# Patient Record
Sex: Female | Born: 1975 | Race: Black or African American | Hispanic: No | Marital: Married | Smoking: Never smoker
Health system: Southern US, Community
[De-identification: ages and names within clinical notes are randomized; demographics above are authoritative.]

## PROBLEM LIST (undated history)

## (undated) DIAGNOSIS — F39 Unspecified mood [affective] disorder: Secondary | ICD-10-CM

## (undated) DIAGNOSIS — O139 Gestational [pregnancy-induced] hypertension without significant proteinuria, unspecified trimester: Secondary | ICD-10-CM

## (undated) DIAGNOSIS — Z860100 Personal history of colon polyps, unspecified: Secondary | ICD-10-CM

## (undated) DIAGNOSIS — J45909 Unspecified asthma, uncomplicated: Secondary | ICD-10-CM

## (undated) DIAGNOSIS — R519 Headache, unspecified: Secondary | ICD-10-CM

## (undated) DIAGNOSIS — I10 Essential (primary) hypertension: Secondary | ICD-10-CM

## (undated) DIAGNOSIS — E119 Type 2 diabetes mellitus without complications: Secondary | ICD-10-CM

## (undated) HISTORY — DX: Personal history of colonic polyps: Z86.010

## (undated) HISTORY — DX: Essential (primary) hypertension: I10

## (undated) HISTORY — DX: Headache, unspecified: R51.9

## (undated) HISTORY — DX: Type 2 diabetes mellitus without complications: E11.9

## (undated) HISTORY — PX: WISDOM TOOTH EXTRACTION: SHX21

## (undated) HISTORY — DX: Unspecified mood (affective) disorder: F39

## (undated) HISTORY — DX: Unspecified asthma, uncomplicated: J45.909

## (undated) HISTORY — DX: Personal history of colon polyps, unspecified: Z86.0100

## (undated) HISTORY — PX: BREAST REDUCTION SURGERY: SHX8

---

## 2004-07-19 ENCOUNTER — Other Ambulatory Visit: Admission: RE | Admit: 2004-07-19 | Discharge: 2004-07-19 | Payer: Self-pay | Admitting: Family Medicine

## 2005-10-04 ENCOUNTER — Other Ambulatory Visit: Admission: RE | Admit: 2005-10-04 | Discharge: 2005-10-04 | Payer: Self-pay | Admitting: Family Medicine

## 2005-10-14 ENCOUNTER — Ambulatory Visit (HOSPITAL_COMMUNITY): Admission: RE | Admit: 2005-10-14 | Discharge: 2005-10-14 | Payer: Self-pay | Admitting: Obstetrics and Gynecology

## 2006-01-17 ENCOUNTER — Ambulatory Visit (HOSPITAL_COMMUNITY): Admission: RE | Admit: 2006-01-17 | Discharge: 2006-01-17 | Payer: Self-pay | Admitting: Obstetrics and Gynecology

## 2006-05-25 ENCOUNTER — Inpatient Hospital Stay (HOSPITAL_COMMUNITY): Admission: AD | Admit: 2006-05-25 | Discharge: 2006-05-28 | Payer: Self-pay | Admitting: Obstetrics and Gynecology

## 2006-06-03 ENCOUNTER — Inpatient Hospital Stay (HOSPITAL_COMMUNITY): Admission: AD | Admit: 2006-06-03 | Discharge: 2006-06-03 | Payer: Self-pay | Admitting: Obstetrics and Gynecology

## 2006-08-22 HISTORY — PX: REDUCTION MAMMAPLASTY: SUR839

## 2008-07-14 ENCOUNTER — Other Ambulatory Visit: Admission: RE | Admit: 2008-07-14 | Discharge: 2008-07-14 | Payer: Self-pay | Admitting: Obstetrics and Gynecology

## 2008-10-28 ENCOUNTER — Encounter: Admission: RE | Admit: 2008-10-28 | Discharge: 2008-10-28 | Payer: Self-pay | Admitting: Obstetrics and Gynecology

## 2010-05-21 ENCOUNTER — Inpatient Hospital Stay (HOSPITAL_COMMUNITY): Admission: AD | Admit: 2010-05-21 | Discharge: 2010-05-21 | Payer: Self-pay | Admitting: Obstetrics and Gynecology

## 2010-07-27 ENCOUNTER — Other Ambulatory Visit
Admission: RE | Admit: 2010-07-27 | Discharge: 2010-07-27 | Payer: Self-pay | Source: Home / Self Care | Admitting: Obstetrics and Gynecology

## 2010-11-04 LAB — CBC
HCT: 35.5 % — ABNORMAL LOW (ref 36.0–46.0)
Hemoglobin: 11.8 g/dL — ABNORMAL LOW (ref 12.0–15.0)
MCH: 29.5 pg (ref 26.0–34.0)
MCHC: 33.3 g/dL (ref 30.0–36.0)
MCV: 88.7 fL (ref 78.0–100.0)
Platelets: 257 10*3/uL (ref 150–400)
RBC: 4 MIL/uL (ref 3.87–5.11)
RDW: 17.7 % — ABNORMAL HIGH (ref 11.5–15.5)

## 2010-11-04 LAB — HCG, QUANTITATIVE, PREGNANCY: hCG, Beta Chain, Quant, S: 16249 m[IU]/mL — ABNORMAL HIGH (ref <5)

## 2011-06-30 ENCOUNTER — Inpatient Hospital Stay (HOSPITAL_COMMUNITY)
Admission: AD | Admit: 2011-06-30 | Discharge: 2011-07-03 | DRG: 775 | Disposition: A | Discharge status: Home / Self Care | Payer: 59 | Source: Ambulatory Visit | Attending: Obstetrics and Gynecology | Admitting: Obstetrics and Gynecology

## 2011-06-30 ENCOUNTER — Encounter (HOSPITAL_COMMUNITY): Payer: Self-pay | Admitting: *Deleted

## 2011-06-30 HISTORY — DX: Gestational (pregnancy-induced) hypertension without significant proteinuria, unspecified trimester: O13.9

## 2011-06-30 NOTE — Progress Notes (Signed)
Pt c/o ctx that have been getting stronger at home.  No leaking of fluid or bleeding.

## 2011-07-01 ENCOUNTER — Encounter (HOSPITAL_COMMUNITY): Payer: Self-pay

## 2011-07-01 LAB — CBC
HCT: 30.2 % — ABNORMAL LOW (ref 36.0–46.0)
MCV: 84.8 fL (ref 78.0–100.0)
Platelets: 232 10*3/uL (ref 150–400)
RBC: 3.56 MIL/uL — ABNORMAL LOW (ref 3.87–5.11)
WBC: 6.7 10*3/uL (ref 4.0–10.5)

## 2011-07-01 LAB — ABO/RH: RH Type: POSITIVE

## 2011-07-01 LAB — RUBELLA ANTIBODY, IGM: Rubella: IMMUNE

## 2011-07-01 LAB — HIV ANTIBODY (ROUTINE TESTING W REFLEX): HIV: NONREACTIVE

## 2011-07-01 MED ORDER — OXYTOCIN BOLUS FROM INFUSION
500.0000 mL | Freq: Once | INTRAVENOUS | Status: DC
  Filled 2011-07-01: qty 500
  Filled 2011-07-01: qty 1000

## 2011-07-01 MED ORDER — TETANUS-DIPHTH-ACELL PERTUSSIS 5-2.5-18.5 LF-MCG/0.5 IM SUSP
0.5000 mL | Freq: Once | INTRAMUSCULAR | Status: DC
  Filled 2011-07-01: qty 0.5

## 2011-07-01 MED ORDER — ONDANSETRON HCL 4 MG/2ML IJ SOLN: 4.0000 mg | INTRAMUSCULAR | Status: DC | PRN

## 2011-07-01 MED ORDER — WITCH HAZEL-GLYCERIN EX PADS: 1.0000 "application " | MEDICATED_PAD | CUTANEOUS | Status: DC | PRN

## 2011-07-01 MED ORDER — SIMETHICONE 80 MG PO CHEW: 80.0000 mg | CHEWABLE_TABLET | ORAL | Status: DC | PRN

## 2011-07-01 MED ORDER — IBUPROFEN 600 MG PO TABS
600.0000 mg | ORAL_TABLET | Freq: Four times a day (QID) | ORAL | Status: DC | PRN
  Administered 2011-07-01: 600 mg via ORAL
  Filled 2011-07-01: qty 1

## 2011-07-01 MED ORDER — TERBUTALINE SULFATE 1 MG/ML IJ SOLN: 0.2500 mg | Freq: Once | INTRAMUSCULAR | Status: DC | PRN

## 2011-07-01 MED ORDER — OXYTOCIN 20 UNITS IN LACTATED RINGERS INFUSION - SIMPLE
1.0000 m[IU]/min | INTRAVENOUS | Status: DC
  Administered 2011-07-01: 9 m[IU]/min via INTRAVENOUS
  Administered 2011-07-01: 7 m[IU]/min via INTRAVENOUS
  Administered 2011-07-01: 1 m[IU]/min via INTRAVENOUS

## 2011-07-01 MED ORDER — IBUPROFEN 600 MG PO TABS
600.0000 mg | ORAL_TABLET | Freq: Four times a day (QID) | ORAL | Status: DC
  Administered 2011-07-02: 600 mg via ORAL
  Filled 2011-07-01: qty 1

## 2011-07-01 MED ORDER — FLEET ENEMA 7-19 GM/118ML RE ENEM: 1.0000 | ENEMA | RECTAL | Status: DC | PRN

## 2011-07-01 MED ORDER — ONDANSETRON HCL 4 MG PO TABS: 4.0000 mg | ORAL_TABLET | ORAL | Status: DC | PRN

## 2011-07-01 MED ORDER — ZOLPIDEM TARTRATE 10 MG PO TABS
10.0000 mg | ORAL_TABLET | Freq: Once | ORAL | Status: AC
  Administered 2011-07-01: 10 mg via ORAL
  Filled 2011-07-01 (×2): qty 1

## 2011-07-01 MED ORDER — ZOLPIDEM TARTRATE 5 MG PO TABS: 5.0000 mg | ORAL_TABLET | Freq: Every evening | ORAL | Status: DC | PRN

## 2011-07-01 MED ORDER — SENNOSIDES-DOCUSATE SODIUM 8.6-50 MG PO TABS: 2.0000 | ORAL_TABLET | Freq: Every day | ORAL | Status: DC

## 2011-07-01 MED ORDER — DIPHENHYDRAMINE HCL 25 MG PO CAPS: 25.0000 mg | ORAL_CAPSULE | Freq: Four times a day (QID) | ORAL | Status: DC | PRN

## 2011-07-01 MED ORDER — LIDOCAINE HCL (PF) 1 % IJ SOLN
30.0000 mL | INTRAMUSCULAR | Status: DC | PRN
  Filled 2011-07-01: qty 30

## 2011-07-01 MED ORDER — IBUPROFEN 600 MG PO TABS: 600.0000 mg | ORAL_TABLET | Freq: Four times a day (QID) | ORAL | Status: DC | PRN

## 2011-07-01 MED ORDER — LACTATED RINGERS IV SOLN: 500.0000 mL | INTRAVENOUS | Status: DC | PRN

## 2011-07-01 MED ORDER — ONDANSETRON HCL 4 MG/2ML IJ SOLN: 4.0000 mg | Freq: Four times a day (QID) | INTRAMUSCULAR | Status: DC | PRN

## 2011-07-01 MED ORDER — CITRIC ACID-SODIUM CITRATE 334-500 MG/5ML PO SOLN: 30.0000 mL | ORAL | Status: DC | PRN

## 2011-07-01 MED ORDER — TETANUS-DIPHTH-ACELL PERTUSSIS 5-2.5-18.5 LF-MCG/0.5 IM SUSP
0.5000 mL | Freq: Once | INTRAMUSCULAR | Status: AC
  Administered 2011-07-02: 0.5 mL via INTRAMUSCULAR
  Filled 2011-07-01 (×2): qty 0.5

## 2011-07-01 MED ORDER — BENZOCAINE-MENTHOL 20-0.5 % EX AERO
1.0000 "application " | INHALATION_SPRAY | CUTANEOUS | Status: DC | PRN
  Filled 2011-07-01: qty 56

## 2011-07-01 MED ORDER — MEDROXYPROGESTERONE ACETATE 150 MG/ML IM SUSP: 150.0000 mg | INTRAMUSCULAR | Status: DC | PRN

## 2011-07-01 MED ORDER — OXYCODONE-ACETAMINOPHEN 5-325 MG PO TABS
2.0000 | ORAL_TABLET | ORAL | Status: DC | PRN
  Filled 2011-07-01: qty 1
  Filled 2011-07-01: qty 2

## 2011-07-01 MED ORDER — OXYCODONE-ACETAMINOPHEN 5-325 MG PO TABS
1.0000 | ORAL_TABLET | ORAL | Status: DC | PRN
  Administered 2011-07-02: 1 via ORAL
  Administered 2011-07-02: 2 via ORAL
  Administered 2011-07-02 – 2011-07-03 (×2): 1 via ORAL
  Administered 2011-07-03: 2 via ORAL
  Administered 2011-07-03: 1 via ORAL
  Filled 2011-07-01: qty 1
  Filled 2011-07-01 (×3): qty 2
  Filled 2011-07-01 (×2): qty 1

## 2011-07-01 MED ORDER — ACETAMINOPHEN 325 MG PO TABS: 650.0000 mg | ORAL_TABLET | ORAL | Status: DC | PRN

## 2011-07-01 MED ORDER — MEASLES, MUMPS & RUBELLA VAC ~~LOC~~ INJ
0.5000 mL | INJECTION | Freq: Once | SUBCUTANEOUS | Status: DC
  Filled 2011-07-01: qty 0.5

## 2011-07-01 MED ORDER — PRENATAL PLUS 27-1 MG PO TABS: 1.0000 | ORAL_TABLET | Freq: Every day | ORAL | Status: DC

## 2011-07-01 MED ORDER — DIBUCAINE 1 % RE OINT
1.0000 "application " | TOPICAL_OINTMENT | RECTAL | Status: DC | PRN
  Filled 2011-07-01: qty 28

## 2011-07-01 MED ORDER — LANOLIN HYDROUS EX OINT: TOPICAL_OINTMENT | CUTANEOUS | Status: DC | PRN

## 2011-07-01 MED ORDER — OXYTOCIN 20 UNITS IN LACTATED RINGERS INFUSION - SIMPLE
125.0000 mL/h | Freq: Once | INTRAVENOUS | Status: AC
  Administered 2011-07-01: 999 mL/h via INTRAVENOUS

## 2011-07-01 MED ORDER — LACTATED RINGERS IV SOLN
INTRAVENOUS | Status: DC
  Administered 2011-07-01 (×2): via INTRAVENOUS

## 2011-07-01 MED ORDER — SENNOSIDES-DOCUSATE SODIUM 8.6-50 MG PO TABS
2.0000 | ORAL_TABLET | Freq: Every day | ORAL | Status: DC
  Administered 2011-07-02: 2 via ORAL

## 2011-07-01 MED ORDER — BUTORPHANOL TARTRATE 2 MG/ML IJ SOLN
1.0000 mg | INTRAMUSCULAR | Status: DC | PRN
  Administered 2011-07-01 (×2): 1 mg via INTRAVENOUS
  Filled 2011-07-01 (×2): qty 1

## 2011-07-01 MED ORDER — PRENATAL PLUS 27-1 MG PO TABS
1.0000 | ORAL_TABLET | Freq: Every day | ORAL | Status: DC
  Administered 2011-07-03: 1 via ORAL
  Filled 2011-07-01: qty 1

## 2011-07-01 MED ORDER — IBUPROFEN 600 MG PO TABS
600.0000 mg | ORAL_TABLET | Freq: Four times a day (QID) | ORAL | Status: DC
  Administered 2011-07-02 – 2011-07-03 (×5): 600 mg via ORAL
  Filled 2011-07-01 (×5): qty 1

## 2011-07-01 MED ORDER — OXYCODONE-ACETAMINOPHEN 5-325 MG PO TABS: 2.0000 | ORAL_TABLET | ORAL | Status: DC | PRN

## 2011-07-01 MED ORDER — OXYCODONE-ACETAMINOPHEN 5-325 MG PO TABS: 1.0000 | ORAL_TABLET | ORAL | Status: DC | PRN

## 2011-07-01 MED ORDER — LIDOCAINE HCL (PF) 1 % IJ SOLN: 30.0000 mL | INTRAMUSCULAR | Status: DC | PRN

## 2011-07-01 NOTE — Progress Notes (Signed)
In to assess patient.  Cx 5/ 70/-1 Arom clear fluid IUPC placed.  FHR baseline 130 good btbv +accels no decels Toco : ctx q 2-3 minutes  A/P 38 wks 6 days  Pitocin for augmentation  Anticipate svd

## 2011-07-01 NOTE — Progress Notes (Signed)
Pt transferred from 169 back to MAU rm#3 due to pt acuity in [facility] of care is for intermittent monitoring and to give pt Ambien 10 mg PO-to recheck cervix at 0600

## 2011-07-01 NOTE — H&P (Signed)
Julia Odom is a 35 y.o. female presenting for c/o of ctx. 3-4 minutes she was admitted for observation and changed from 2 cm on presentation to 4 cm. No lof. No vaginal bleeding. +FM   OB History    Grav Para Term Preterm Abortions TAB SAB Ect Mult Living   3 1 1  1  1   1      Past Medical History  Diagnosis Date  . Pregnancy induced hypertension    Past Surgical History  Procedure Date  . Breast reduction surgery   . Wisdom tooth extraction    Family History: family history includes Cancer in her father and mother and Diabetes in her mother. Social History:  reports that she has never smoked. She does not have any smokeless tobacco history on file. She reports that she drinks alcohol. She reports that she does not use illicit drugs.  PGyn Hx none  Medications pNV, claritin D  Dilation: 3.5 Effacement (%): 70 Station: -2 Exam by:: M.Topp,RN Blood pressure 128/89, pulse 98, temperature 98.1 F (36.7 C), temperature source Oral, resp. rate 18, height 5' (1.524 m), weight 87.091 kg (192 lb).  CV: RRR  Lungs: Clear  Abd: Gravid  Ext. 1+ edema  cx 3-4 cm/50/-3 FHR baseline120's good BTBV Prenatal labs: ABO, Rh: A/Positive/-- (11/09 0000) Antibody: Negative (11/09 0000) Rubella:   RPR: Nonreactive (11/09 0000)  HBsAg: Negative (11/09 0000)  HIV: Non-reactive (11/09 0000)  GBS: Negative (11/09 0000)   Assessment/Plan: 38 wks 6 days  Latent labor  Pitocin for augmenation    Julia Odom J. 07/01/2011, 8:17 AM

## 2011-07-01 NOTE — Progress Notes (Signed)
In to access patient.  cx 8/75/-2 FHR baseline 140's good btbv + access variable decels toc ctx q2-3 min A/P 38 wks 6 days labor  Pitocin for augmentation Anticipate svd Dr. Neva Seat covering this evening. Pt has been checked out to Dr. Sarita Haver

## 2011-07-02 LAB — CBC
HCT: 27.3 % — ABNORMAL LOW (ref 36.0–46.0)
Hemoglobin: 8.9 g/dL — ABNORMAL LOW (ref 12.0–15.0)
MCH: 27.8 pg (ref 26.0–34.0)
MCHC: 32.6 g/dL (ref 30.0–36.0)

## 2011-07-02 MED ORDER — FERROUS SULFATE 325 (65 FE) MG PO TABS
325.0000 mg | ORAL_TABLET | Freq: Two times a day (BID) | ORAL | Status: DC
  Administered 2011-07-02 – 2011-07-03 (×2): 325 mg via ORAL
  Filled 2011-07-02 (×2): qty 1

## 2011-07-02 NOTE — Progress Notes (Addendum)
Post Partum Day 1 Subjective: no complaints  Objective: Blood pressure 137/65, pulse 91, temperature 97.6 F (36.4 C), temperature source Oral, resp. rate 18, height 5' (1.524 m), weight 192 lb (87.091 kg), unknown if currently breastfeeding.  Physical Exam:  General: alert and no distress Lochia: appropriate Uterine Fundus: firm Episiotomy:  none DVT Evaluation: No evidence of DVT seen on physical exam.   Basename 07/02/11 0601 07/01/11 0750  HGB 8.9* 9.8*  HCT 27.3* 30.2*    Assessment/Plan: Plan for discharge tomorrow   LOS: 2 days   GREENE,ELEANOR E 07/02/2011, 12:38 PM

## 2011-07-02 NOTE — Progress Notes (Signed)
Delivery Note At 8:40 PM a viable female was delivered via Vaginal, Spontaneous Delivery (Presentation: Right Occiput Anterior) Nuchal cord  x1.  APGAR: 8, 9; weight 7 lb 11.1 oz (3490 g).   Placenta status: Intact, Spontaneous.  Cord: 3 vessels with the following complications: None.   Anesthesia: None  Episiotomy:  2nd degree and repair Lacerations: None Suture Repair: 2 0 chromic Est. Blood Loss (mL)  500cc   Mom to postpartum.  Baby to nursery-stable.  Julia Odom E 07/02/2011, 12:34 PM

## 2011-07-03 MED ORDER — BENZOCAINE-MENTHOL 20-0.5 % EX AERO
INHALATION_SPRAY | CUTANEOUS | Status: AC
  Administered 2011-07-03: 12:00:00
  Filled 2011-07-03: qty 56

## 2011-07-03 NOTE — Discharge Summary (Signed)
Obstetric Discharge Summary Reason for Admission: onset of labor Prenatal Procedures: none Intrapartum Procedures: spontaneous vaginal delivery Postpartum Procedures: none Complications-Operative and Postpartum: none Hemoglobin  Date Value Range Status  07/02/2011 8.9* 12.0-15.0 (g/dL) Final     HCT  Date Value Range Status  07/02/2011 27.3* 36.0-46.0 (%) Final    Discharge Diagnoses: Term Pregnancy-delivered  Discharge Information: Date: 07/03/2011 Activity: pelvic rest Diet: routine Medications: percocet, motrin, iron, prenatal vitamins, colace Condition: stable Instructions: refer to practice specific booklet Discharge to: home   Newborn Data: Live born female  Birth Weight: 7 lb 11.1 oz (3490 g) APGAR: 8, 9  Home with mother.  Chariti Havel E 07/03/2011, 11:57 AM

## 2011-07-03 NOTE — Progress Notes (Signed)
Post Partum Day  2 Subjective: no complaints, up ad lib and tolerating PO  Objective: Blood pressure 132/78, pulse 74, temperature 98.4 F (36.9 C), temperature source Oral, resp. rate 18, height 5' (1.524 m), weight 192 lb (87.091 kg), unknown if currently breastfeeding.  Physical Exam:  General: alert and no distress Lochia: appropriate Uterine Fundus: firm Episiotomy, laceration : none DVT Evaluation: No evidence of DVT seen on physical exam.   Basename 07/02/11 0601 07/01/11 0750  HGB 8.9* 9.8*  HCT 27.3* 30.2*    Assessment/Plan: Discharge home   LOS: 3 days   Douglas Rooks E 07/03/2011, 12:00 PM

## 2011-11-17 ENCOUNTER — Other Ambulatory Visit (HOSPITAL_COMMUNITY)
Admission: RE | Admit: 2011-11-17 | Discharge: 2011-11-17 | Disposition: A | Discharge status: Home / Self Care | Payer: 59 | Source: Ambulatory Visit | Attending: Obstetrics and Gynecology | Admitting: Obstetrics and Gynecology

## 2011-11-17 ENCOUNTER — Other Ambulatory Visit: Payer: Self-pay | Admitting: Obstetrics and Gynecology

## 2011-11-17 DIAGNOSIS — Z01419 Encounter for gynecological examination (general) (routine) without abnormal findings: Secondary | ICD-10-CM | POA: Insufficient documentation

## 2012-05-11 ENCOUNTER — Ambulatory Visit
Admission: RE | Admit: 2012-05-11 | Discharge: 2012-05-11 | Disposition: A | Discharge status: Home / Self Care | Payer: 59 | Source: Ambulatory Visit | Attending: Allergy | Admitting: Allergy

## 2012-05-11 ENCOUNTER — Other Ambulatory Visit: Payer: Self-pay | Admitting: Allergy

## 2012-05-11 DIAGNOSIS — J45909 Unspecified asthma, uncomplicated: Secondary | ICD-10-CM

## 2012-11-28 ENCOUNTER — Other Ambulatory Visit (HOSPITAL_COMMUNITY)
Admission: RE | Admit: 2012-11-28 | Discharge: 2012-11-28 | Disposition: A | Discharge status: Home / Self Care | Payer: 59 | Source: Ambulatory Visit | Attending: Obstetrics and Gynecology | Admitting: Obstetrics and Gynecology

## 2012-11-28 ENCOUNTER — Other Ambulatory Visit: Payer: Self-pay | Admitting: Obstetrics and Gynecology

## 2012-11-28 DIAGNOSIS — Z1151 Encounter for screening for human papillomavirus (HPV): Secondary | ICD-10-CM | POA: Insufficient documentation

## 2012-11-28 DIAGNOSIS — Z01419 Encounter for gynecological examination (general) (routine) without abnormal findings: Secondary | ICD-10-CM | POA: Insufficient documentation

## 2013-12-03 ENCOUNTER — Other Ambulatory Visit (HOSPITAL_COMMUNITY)
Admission: RE | Admit: 2013-12-03 | Discharge: 2013-12-03 | Disposition: A | Discharge status: Home / Self Care | Payer: 59 | Source: Ambulatory Visit | Attending: Obstetrics and Gynecology | Admitting: Obstetrics and Gynecology

## 2013-12-03 ENCOUNTER — Other Ambulatory Visit: Payer: Self-pay | Admitting: Obstetrics and Gynecology

## 2013-12-03 DIAGNOSIS — Z01419 Encounter for gynecological examination (general) (routine) without abnormal findings: Secondary | ICD-10-CM | POA: Insufficient documentation

## 2014-06-17 ENCOUNTER — Encounter (HOSPITAL_COMMUNITY): Payer: Self-pay | Admitting: Emergency Medicine

## 2014-06-17 ENCOUNTER — Emergency Department (HOSPITAL_COMMUNITY): Payer: 59

## 2014-06-17 ENCOUNTER — Emergency Department (HOSPITAL_COMMUNITY)
Admission: EM | Admit: 2014-06-17 | Discharge: 2014-06-17 | Disposition: A | Discharge status: Home / Self Care | Payer: 59 | Attending: Emergency Medicine | Admitting: Emergency Medicine

## 2014-06-17 DIAGNOSIS — M25512 Pain in left shoulder: Secondary | ICD-10-CM | POA: Insufficient documentation

## 2014-06-17 DIAGNOSIS — Z79899 Other long term (current) drug therapy: Secondary | ICD-10-CM | POA: Diagnosis not present

## 2014-06-17 DIAGNOSIS — M25519 Pain in unspecified shoulder: Secondary | ICD-10-CM

## 2014-06-17 DIAGNOSIS — M542 Cervicalgia: Secondary | ICD-10-CM | POA: Insufficient documentation

## 2014-06-17 MED ORDER — MELOXICAM 7.5 MG PO TABS: 7.5000 mg | ORAL_TABLET | Freq: Every day | ORAL | Status: DC

## 2014-06-17 MED ORDER — CYCLOBENZAPRINE HCL 10 MG PO TABS: 10.0000 mg | ORAL_TABLET | Freq: Two times a day (BID) | ORAL | Status: DC | PRN

## 2014-06-17 MED ORDER — HYDROCODONE-ACETAMINOPHEN 5-325 MG PO TABS: 1.0000 | ORAL_TABLET | ORAL | Status: DC | PRN

## 2014-06-17 NOTE — Discharge Instructions (Signed)
Take Mobic for inflammation. You may also take Vicodin as needed for additional pain relief. Take Flexeril as needed for muscle spasm. Follow up with your primary care provider. Follow up with Orthopedics if symptoms do not resolve.

## 2014-06-17 NOTE — ED Notes (Signed)
Pt reports pain in left shoulder that is radiating down left arm with a numbness to her hand. Denies fall or injury. No hx of previous shoulder or neck problems.

## 2014-06-17 NOTE — ED Provider Notes (Signed)
CSN: [id number]     Arrival date & time 06/17/14  [id number] History   First MD Initiated Contact with Patient 06/17/14 1013     Chief Complaint  Patient presents with  . Shoulder Pain  . Arm Pain     (Consider location/radiation/quality/duration/timing/severity/associated sxs/prior Treatment) HPI Comments: Patient is a 38 year old female who presents with left shoulder pain that started 1 month ago. The pain started gradually and remained constant since the onset. The pain is aching and severe with radiation down her left arm. Movement makes the pain worse, especially when she picks up her 40 year old son. No alleviating factors. Patient has tried tylenol for pain without relief. Patient denies any injury.   Patient is a 38 y.o. female presenting with shoulder pain and arm pain.  Shoulder Pain Associated symptoms include arthralgias. Pertinent negatives include no abdominal pain, chest pain, chills, fatigue, fever, nausea, neck pain, vomiting or weakness.  Arm Pain Associated symptoms include arthralgias. Pertinent negatives include no abdominal pain, chest pain, chills, fatigue, fever, nausea, neck pain, vomiting or weakness.    Past Medical History  Diagnosis Date  . Pregnancy induced hypertension    Past Surgical History  Procedure Laterality Date  . Breast reduction surgery    . Wisdom tooth extraction     Family History  Problem Relation Age of Onset  . Diabetes Mother   . Cancer Mother   . Cancer Father    History  Substance Use Topics  . Smoking status: Never Smoker   . Smokeless tobacco: Not on file  . Alcohol Use: No     Comment: occasional but not while pregnant   OB History   Grav Para Term Preterm Abortions TAB SAB Ect Mult Living   3 2 2  1  1   1      Review of Systems  Constitutional: Negative for fever, chills and fatigue.  HENT: Negative for trouble swallowing.   Eyes: Negative for visual disturbance.  Respiratory: Negative for shortness of breath.    Cardiovascular: Negative for chest pain and palpitations.  Gastrointestinal: Negative for nausea, vomiting, abdominal pain and diarrhea.  Genitourinary: Negative for dysuria and difficulty urinating.  Musculoskeletal: Positive for arthralgias. Negative for neck pain.  Skin: Negative for color change.  Neurological: Negative for dizziness and weakness.  Psychiatric/Behavioral: Negative for dysphoric mood.      Allergies  Review of patient's allergies indicates no known allergies.  Home Medications   Prior to Admission medications   Medication Sig Start Date End Date Taking? Authorizing Provider  acetaminophen (TYLENOL) 500 MG tablet Take 1,000 mg by mouth every 6 (six) hours as needed for mild pain.   Yes Historical Provider, MD  albuterol (PROAIR HFA) 108 (90 BASE) MCG/ACT inhaler Inhale 2 puffs into the lungs every 6 (six) hours as needed for wheezing or shortness of breath.   Yes Historical Provider, MD  albuterol (PROVENTIL HFA;VENTOLIN HFA) 108 (90 BASE) MCG/ACT inhaler Inhale 2 puffs into the lungs every 6 (six) hours as needed for wheezing or shortness of breath.   Yes Historical Provider, MD  levocetirizine (XYZAL) 5 MG tablet Take 5 mg by mouth every evening.   Yes Historical Provider, MD  montelukast (SINGULAIR) 10 MG tablet Take 10 mg by mouth at bedtime.   Yes Historical Provider, MD   BP 149/88  Pulse 65  Temp(Src) 98.3 F (36.8 C) (Oral)  Resp 18  SpO2 100%  LMP 05/20/2014  Breastfeeding? No Physical Exam  Nursing note  and vitals reviewed. Constitutional: She appears well-developed and well-nourished. No distress.  HENT:  Head: Normocephalic and atraumatic.  Eyes: Conjunctivae and EOM are normal.  Neck: Normal range of motion.  Cardiovascular: Normal rate and regular rhythm.  Exam reveals no gallop and no friction rub.   No murmur heard. Pulmonary/Chest: Effort normal and breath sounds normal. She has no wheezes. She has no rales. She exhibits no tenderness.   Abdominal: Soft. There is no tenderness.  Musculoskeletal:  Limited ROM of left shoulder due to pain. No obvious deformity. Tenderness to palpation over AC joint. No midline spine tenderness to palpation.   Neurological: She is alert. Coordination normal.  Speech is goal-oriented. Moves limbs without ataxia.   Skin: Skin is warm and dry.  Psychiatric: She has a normal mood and affect. Her behavior is normal.    ED Course  Procedures (including critical care time) Labs Review Labs Reviewed - No data to display  Imaging Review Dg Cervical Spine Complete  06/17/2014   CLINICAL DATA:  Five day history of neck pain radiating to left upper extremity. No history of trauma  EXAM: CERVICAL SPINE  4+ VIEWS  COMPARISON:  None.  FINDINGS: Frontal, lateral, open-mouth odontoid, and bilateral oblique views were obtained. There is no fracture or spondylolisthesis. Prevertebral soft tissues and predental space regions are normal. There is moderate disc space narrowing at C5-6 and C6-7. There is mild disc space narrowing at C4-5 and C7-T1. There is a prominent anterior osteophyte along the inferior aspect of the C6 vertebral body. There is no significant exit foraminal narrowing on the oblique views. There is reversal of lordotic curvature.  IMPRESSION: Areas of osteoarthritic change, most notably at C6-7. No fracture or spondylolisthesis. Reversal of lordotic curvature is probably indicative of muscle spasm. If there is concern for ligamentous injury, however, lateral flexion-extension views could be helpful to further assess.   Electronically Signed   By: Bretta BangWilliam  Woodruff M.D.   On: 06/17/2014 11:40   Dg Shoulder Left  06/17/2014   CLINICAL DATA:  38 year old female with left shoulder and arm pain for 5 days. No known injury.  EXAM: LEFT SHOULDER - 2+ VIEW  COMPARISON:  None.  FINDINGS: Minimal acromioclavicular joint degenerative changes.  No fracture or dislocation.  No abnormal soft tissue calcification.   IMPRESSION: Minimal acromioclavicular joint degenerative changes.   Electronically Signed   By: Bridgett LarssonSteve  Olson M.D.   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 Patient will have Mobic, Tramadol and Flexeril for pain. Patient will have Orthopedic referral if symptoms do not resolve.    Emilia BeckKaitlyn Armoni Kludt, PA-C 06/17/14 1210  Emilia BeckKaitlyn Matthan Sledge, PA-C 06/17/14 1214

## 2014-06-17 NOTE — Progress Notes (Signed)
  CARE MANAGEMENT ED NOTE 06/17/2014  Patient:  Julia Odom,Julia Odom   Account Number:  [id number]  Date Initiated:  06/17/2014  Documentation initiated by:  Edd ArbourGIBBS,Elkin Belfield  Subjective/Objective Assessment:   38 yr old [employer] pt from [facility] pain in left shoulder     Subjective/Objective Assessment Detail:   Pt confirms she does not have a pcp, only a ob gyn providers Dr Richardson Doppole  female at bedside     Action/Plan:   Noted not pcp Spoke with pt and encouraged her to obtain a pcp for ED f/u care Discussed importance of f/u after ED visits as generally recommended at d/c   Action/Plan Detail:   Anticipated DC Date:  06/17/2014     Status Recommendation to Physician:   Result of Recommendation:    Other ED Services  Consult Working Plan    DC Planning Services  Other  PCP issues  Outpatient Services - Pt will follow up  Outpatient Services - Pt will follow up    Choice offered to / List presented to:            Status of service:  Completed, signed off  ED Comments:   ED Comments Detail:  [facility] ED CM spoke with pt on how to obtain an in network pcp with insurance coverage via the customer service number or web site Cm reviewed ED level of care for crisis/emergent services and community pcp level of care to manage continuous or chronic medical concerns.  The pt voiced understanding CM encouraged pt and discussed pt's responsibility to verify with pt's insurance carrier that any recommended medical provider offered by any emergency room or a hospital provider is within the carrier's network. The pt voiced understanding

## 2014-06-17 NOTE — ED Provider Notes (Signed)
Medical screening examination/treatment/procedure(s) were performed by non-physician practitioner and as supervising physician I was immediately available for consultation/collaboration.     Jaimere Feutz, MD 06/17/14 1559 

## 2014-06-17 NOTE — ED Notes (Signed)
AVS in hand. Explained in detail. Knows not to take extra tylenol/drink/drive/operate heavy machinery with prescribed medications. [place] No other questions/concerns. Denies need for wheelchair.

## 2014-06-23 ENCOUNTER — Encounter (HOSPITAL_COMMUNITY): Payer: Self-pay | Admitting: Emergency Medicine

## 2014-12-09 ENCOUNTER — Other Ambulatory Visit: Payer: Self-pay | Admitting: Obstetrics and Gynecology

## 2014-12-09 ENCOUNTER — Other Ambulatory Visit (HOSPITAL_COMMUNITY)
Admission: RE | Admit: 2014-12-09 | Discharge: 2014-12-09 | Disposition: A | Discharge status: Home / Self Care | Payer: 59 | Source: Ambulatory Visit | Attending: Obstetrics and Gynecology | Admitting: Obstetrics and Gynecology

## 2014-12-09 DIAGNOSIS — Z01419 Encounter for gynecological examination (general) (routine) without abnormal findings: Secondary | ICD-10-CM | POA: Diagnosis not present

## 2014-12-10 LAB — CYTOLOGY - PAP

## 2015-01-12 ENCOUNTER — Other Ambulatory Visit: Payer: Self-pay

## 2015-01-12 DIAGNOSIS — Z1231 Encounter for screening mammogram for malignant neoplasm of breast: Secondary | ICD-10-CM

## 2015-01-14 ENCOUNTER — Ambulatory Visit
Admission: RE | Admit: 2015-01-14 | Discharge: 2015-01-14 | Disposition: A | Discharge status: Home / Self Care | Payer: 59 | Source: Ambulatory Visit

## 2015-01-14 DIAGNOSIS — Z1231 Encounter for screening mammogram for malignant neoplasm of breast: Secondary | ICD-10-CM

## 2015-12-07 ENCOUNTER — Other Ambulatory Visit: Payer: Self-pay

## 2015-12-07 DIAGNOSIS — Z9889 Other specified postprocedural states: Secondary | ICD-10-CM

## 2015-12-07 DIAGNOSIS — Z1231 Encounter for screening mammogram for malignant neoplasm of breast: Secondary | ICD-10-CM

## 2015-12-09 ENCOUNTER — Other Ambulatory Visit (HOSPITAL_COMMUNITY)
Admission: RE | Admit: 2015-12-09 | Discharge: 2015-12-09 | Disposition: A | Discharge status: Home / Self Care | Payer: 59 | Source: Ambulatory Visit | Attending: Obstetrics and Gynecology | Admitting: Obstetrics and Gynecology

## 2015-12-09 ENCOUNTER — Other Ambulatory Visit: Payer: Self-pay | Admitting: Obstetrics and Gynecology

## 2015-12-09 DIAGNOSIS — Z1151 Encounter for screening for human papillomavirus (HPV): Secondary | ICD-10-CM | POA: Insufficient documentation

## 2015-12-09 DIAGNOSIS — Z01419 Encounter for gynecological examination (general) (routine) without abnormal findings: Secondary | ICD-10-CM | POA: Diagnosis present

## 2015-12-10 LAB — CYTOLOGY - PAP

## 2016-01-15 ENCOUNTER — Ambulatory Visit: Payer: 59

## 2016-01-29 ENCOUNTER — Other Ambulatory Visit: Payer: Self-pay | Admitting: Obstetrics and Gynecology

## 2016-01-29 ENCOUNTER — Ambulatory Visit
Admission: RE | Admit: 2016-01-29 | Discharge: 2016-01-29 | Disposition: A | Discharge status: Home / Self Care | Payer: 59 | Source: Ambulatory Visit

## 2016-01-29 DIAGNOSIS — Z9889 Other specified postprocedural states: Secondary | ICD-10-CM

## 2016-01-29 DIAGNOSIS — Z1231 Encounter for screening mammogram for malignant neoplasm of breast: Secondary | ICD-10-CM

## 2016-12-14 ENCOUNTER — Other Ambulatory Visit: Payer: Self-pay | Admitting: Obstetrics and Gynecology

## 2016-12-14 DIAGNOSIS — Z9889 Other specified postprocedural states: Secondary | ICD-10-CM

## 2016-12-14 DIAGNOSIS — Z1231 Encounter for screening mammogram for malignant neoplasm of breast: Secondary | ICD-10-CM

## 2017-01-31 ENCOUNTER — Ambulatory Visit: Payer: 59

## 2017-02-17 ENCOUNTER — Ambulatory Visit: Payer: 59

## 2017-02-24 ENCOUNTER — Ambulatory Visit
Admission: RE | Admit: 2017-02-24 | Discharge: 2017-02-24 | Disposition: A | Discharge status: Home / Self Care | Payer: 59 | Source: Ambulatory Visit | Attending: Obstetrics and Gynecology | Admitting: Obstetrics and Gynecology

## 2017-02-24 DIAGNOSIS — Z1231 Encounter for screening mammogram for malignant neoplasm of breast: Secondary | ICD-10-CM

## 2017-02-24 DIAGNOSIS — Z9889 Other specified postprocedural states: Secondary | ICD-10-CM

## 2018-01-23 ENCOUNTER — Other Ambulatory Visit: Payer: Self-pay | Admitting: Obstetrics and Gynecology

## 2018-01-23 DIAGNOSIS — Z1231 Encounter for screening mammogram for malignant neoplasm of breast: Secondary | ICD-10-CM

## 2018-02-28 ENCOUNTER — Ambulatory Visit: Payer: 59

## 2018-02-28 ENCOUNTER — Ambulatory Visit
Admission: RE | Admit: 2018-02-28 | Discharge: 2018-02-28 | Disposition: A | Discharge status: Home / Self Care | Payer: 59 | Source: Ambulatory Visit | Attending: Obstetrics and Gynecology | Admitting: Obstetrics and Gynecology

## 2018-02-28 DIAGNOSIS — Z1231 Encounter for screening mammogram for malignant neoplasm of breast: Secondary | ICD-10-CM

## 2020-01-31 ENCOUNTER — Other Ambulatory Visit: Payer: Self-pay | Admitting: Family Medicine

## 2020-01-31 DIAGNOSIS — Z1231 Encounter for screening mammogram for malignant neoplasm of breast: Secondary | ICD-10-CM

## 2020-02-19 ENCOUNTER — Ambulatory Visit
Admission: RE | Admit: 2020-02-19 | Discharge: 2020-02-19 | Disposition: A | Discharge status: Home / Self Care | Payer: 59 | Source: Ambulatory Visit | Attending: Family Medicine | Admitting: Family Medicine

## 2020-02-19 ENCOUNTER — Other Ambulatory Visit: Payer: Self-pay

## 2020-02-19 DIAGNOSIS — Z1231 Encounter for screening mammogram for malignant neoplasm of breast: Secondary | ICD-10-CM

## 2021-01-01 ENCOUNTER — Other Ambulatory Visit: Payer: Self-pay | Admitting: Obstetrics and Gynecology

## 2021-01-01 DIAGNOSIS — Z1231 Encounter for screening mammogram for malignant neoplasm of breast: Secondary | ICD-10-CM

## 2021-02-26 ENCOUNTER — Ambulatory Visit: Payer: 59

## 2021-03-16 ENCOUNTER — Other Ambulatory Visit: Payer: Self-pay

## 2021-03-16 ENCOUNTER — Ambulatory Visit
Admission: RE | Admit: 2021-03-16 | Discharge: 2021-03-16 | Disposition: A | Discharge status: Home / Self Care | Payer: 59 | Source: Ambulatory Visit | Attending: Obstetrics and Gynecology | Admitting: Obstetrics and Gynecology

## 2021-03-16 DIAGNOSIS — Z1231 Encounter for screening mammogram for malignant neoplasm of breast: Secondary | ICD-10-CM

## 2021-03-19 ENCOUNTER — Other Ambulatory Visit: Payer: Self-pay | Admitting: Obstetrics and Gynecology

## 2021-03-19 DIAGNOSIS — R928 Other abnormal and inconclusive findings on diagnostic imaging of breast: Secondary | ICD-10-CM

## 2021-04-06 ENCOUNTER — Ambulatory Visit: Payer: 59

## 2021-04-06 ENCOUNTER — Other Ambulatory Visit: Payer: Self-pay

## 2021-04-06 ENCOUNTER — Ambulatory Visit
Admission: RE | Admit: 2021-04-06 | Discharge: 2021-04-06 | Disposition: A | Discharge status: Home / Self Care | Payer: 59 | Source: Ambulatory Visit | Attending: Obstetrics and Gynecology | Admitting: Obstetrics and Gynecology

## 2021-04-06 DIAGNOSIS — R928 Other abnormal and inconclusive findings on diagnostic imaging of breast: Secondary | ICD-10-CM

## 2021-06-04 LAB — LIPID PANEL
Cholesterol: 135 (ref 0–200)
HDL: 55 (ref 35–70)
LDL Cholesterol: 67
Triglycerides: 68 (ref 40–160)

## 2021-06-04 LAB — HEMOGLOBIN A1C: Hemoglobin A1C: 6.9

## 2021-11-04 LAB — COMPREHENSIVE METABOLIC PANEL: Calcium: 9.2 (ref 8.7–10.7)

## 2021-11-04 LAB — BASIC METABOLIC PANEL
BUN: 18 (ref 4–21)
Creatinine: 1 (ref 0.5–1.1)

## 2021-11-04 LAB — HEMOGLOBIN A1C: Hemoglobin A1C: 7.4

## 2021-11-18 ENCOUNTER — Ambulatory Visit: Payer: 59 | Admitting: "Endocrinology

## 2021-11-18 ENCOUNTER — Encounter: Payer: Self-pay | Admitting: "Endocrinology

## 2021-11-18 VITALS — BP 104/72 | HR 76 | Ht 60.5 in | Wt 205.0 lb

## 2021-11-18 DIAGNOSIS — E1165 Type 2 diabetes mellitus with hyperglycemia: Secondary | ICD-10-CM

## 2021-11-18 DIAGNOSIS — I10 Essential (primary) hypertension: Secondary | ICD-10-CM | POA: Diagnosis not present

## 2021-11-18 DIAGNOSIS — Z6839 Body mass index (BMI) 39.0-39.9, adult: Secondary | ICD-10-CM

## 2021-11-18 NOTE — Patient Instructions (Signed)

## 2021-11-18 NOTE — Progress Notes (Signed)
? ?                                                             Endocrinology Consult Note  ?     11/18/2021, 1:53 PM ? ? ?Subjective:  ? ? Patient ID: Julia Odom, female    DOB: [birth year].  ?Julia Odom is being seen in consultation for management of currently uncontrolled symptomatic diabetes requested by  Valla LeaverEggleston-Clark, Valenica, MD. ? ? ?Past Medical History:  ?Diagnosis Date  ? Asthma   ? Diabetes mellitus, type II (HCC)   ? Frequent headaches   ? Hx of colonic polyps   ? Hypertension   ? Mood disorder (HCC)   ? Pregnancy induced hypertension   ? ? ?Past Surgical History:  ?Procedure Laterality Date  ? BREAST REDUCTION SURGERY    ? REDUCTION MAMMAPLASTY Bilateral 2008  ? WISDOM TOOTH EXTRACTION    ? ? ?Social History  ? ?Socioeconomic History  ? Marital status: Married  ?  Spouse name: Not on file  ? Number of children: Not on file  ? Years of education: Not on file  ? Highest education level: Not on file  ?Occupational History  ? Not on file  ?Tobacco Use  ? Smoking status: Never  ? Smokeless tobacco: Not on file  ?Vaping Use  ? Vaping Use: Never used  ?Substance and Sexual Activity  ? Alcohol use: Yes  ?  Comment: occasional but not while pregnant  ? Drug use: No  ? Sexual activity: Yes  ?Other Topics Concern  ? Not on file  ?Social History Narrative  ? Not on file  ? ?Social Determinants of Health  ? ?Financial Resource Strain: Not on file  ?Food Insecurity: Not on file  ?Transportation Needs: Not on file  ?Physical Activity: Not on file  ?Stress: Not on file  ?Social Connections: Not on file  ? ? ?Family History  ?Problem Relation Age of Onset  ? Diabetes Mother   ? Cancer Mother   ? Diabetes Father   ? Cancer Father   ? ? ?Outpatient Encounter Medications as of 11/18/2021  ?Medication Sig  ? Cholecalciferol (VITAMIN D3 PO) Take 1 tablet by mouth daily in the afternoon.  ? ELDERBERRY PO Take 1 tablet by mouth daily in the afternoon.  ? fluticasone (FLONASE) 50 MCG/ACT  nasal spray USE 1 TO 2 SPRAYS IN EACH NOSTRIL EVERY DAY  ? Multiple Vitamin (MULTIVITAMIN ADULT PO) Take 1 tablet by mouth daily in the afternoon.  ? ZINC OXIDE PO Take 1 tablet by mouth 2 (two) times a week.  ? [DISCONTINUED] fluticasone furoate-vilanterol (BREO ELLIPTA) 200-25 MCG/ACT AEPB 1 puff  ? acetaminophen (TYLENOL) 500 MG tablet Take 1,000 mg by mouth every 6 (six) hours as needed for mild pain.  ? albuterol (PROAIR HFA) 108 (90 BASE) MCG/ACT inhaler Inhale 2 puffs into the lungs every 6 (six) hours as needed for wheezing or shortness of breath.  ? albuterol (PROVENTIL HFA;VENTOLIN HFA) 108 (90 BASE) MCG/ACT inhaler Inhale 2 puffs into the lungs every 6 (six) hours as needed for wheezing or shortness of breath.  ? Ciclesonide 37 MCG/ACT AERS Place into the nose.  ? glipiZIDE (GLUCOTROL XL) 5 MG 24 hr tablet Take 5 mg by mouth every morning. (Patient not taking:  Reported on 11/18/2021)  ? hydrochlorothiazide (HYDRODIURIL) 25 MG tablet Take 25 mg by mouth daily.  ? levocetirizine (XYZAL) 5 MG tablet Take 5 mg by mouth every evening.  ? losartan (COZAAR) 25 MG tablet Take 25 mg by mouth daily.  ? montelukast (SINGULAIR) 10 MG tablet Take 10 mg by mouth at bedtime.  ? TRELEGY ELLIPTA 200-62.5-25 MCG/ACT AEPB Inhale 1 puff into the lungs daily.  ? [DISCONTINUED] cyclobenzaprine (FLEXERIL) 10 MG tablet Take 1 tablet (10 mg total) by mouth 2 (two) times daily as needed for muscle spasms.  ? [DISCONTINUED] HYDROcodone-acetaminophen (NORCO/VICODIN) 5-325 MG per tablet Take 1-2 tablets by mouth every 4 (four) hours as needed for moderate pain or severe pain.  ? [DISCONTINUED] JARDIANCE 10 MG TABS tablet Take 10 mg by mouth every morning.  ? [DISCONTINUED] meloxicam (MOBIC) 7.5 MG tablet Take 1 tablet (7.5 mg total) by mouth daily.  ? ?No facility-administered encounter medications on file as of 11/18/2021.  ? ? ?ALLERGIES: ?Allergies  ?Allergen Reactions  ? Jardiance [Empagliflozin] Hives  ? Metformin And Related  Nausea And Vomiting  ? ? ?VACCINATION STATUS: ?Immunization History  ?Administered Date(s) Administered  ? Tdap 07/02/2011  ? ? ?Diabetes ?She presents for her initial diabetic visit. She has type 2 diabetes mellitus. Onset time: She was diagnosed at approximate age of 43 years. There are no hypoglycemic associated symptoms. Pertinent negatives for hypoglycemia include no confusion, headaches, pallor or seizures. Associated symptoms include fatigue, polydipsia and polyuria. Pertinent negatives for diabetes include no chest pain and no polyphagia. There are no hypoglycemic complications. There are no diabetic complications. Risk factors for coronary artery disease include obesity, sedentary lifestyle, diabetes mellitus, family history and hypertension. Current diabetic treatments: She did not tolerate Jardiance, metformin, Ozempic.  She was given a prescription for glipizide, however did not fill it. Her weight is fluctuating minimally. She is following a generally unhealthy diet. When asked about meal planning, she reported none. She has not had a previous visit with a dietitian. She never participates in exercise. Her home blood glucose trend is fluctuating minimally. (Her recent A1c was 7.4%.  She is not taking any medications mainly due to intolerance.) An ACE inhibitor/angiotensin II receptor blocker is being taken.  ?Hypertension ?This is a chronic problem. The current episode started more than 1 year ago. The problem is controlled. Pertinent negatives include no chest pain, headaches, palpitations or shortness of breath. Risk factors for coronary artery disease include diabetes mellitus, family history, obesity and sedentary lifestyle. Past treatments include angiotensin blockers.  ? ? ?Review of Systems  ?Constitutional:  Positive for fatigue. Negative for chills, fever and unexpected weight change.  ?HENT:  Negative for trouble swallowing and voice change.   ?Eyes:  Negative for visual disturbance.   ?Respiratory:  Negative for cough, shortness of breath and wheezing.   ?Cardiovascular:  Negative for chest pain, palpitations and leg swelling.  ?Gastrointestinal:  Negative for diarrhea, nausea and vomiting.  ?Endocrine: Positive for polydipsia and polyuria. Negative for cold intolerance, heat intolerance and polyphagia.  ?Musculoskeletal:  Negative for arthralgias and myalgias.  ?Skin:  Negative for color change, pallor, rash and wound.  ?Neurological:  Negative for seizures and headaches.  ?Psychiatric/Behavioral:  Negative for confusion and suicidal ideas.   ? ?Objective:  ?  ? ?  11/18/2021  ? 10:35 AM 06/17/2014  ? 12:18 PM 06/17/2014  ? 10:09 AM  ?Vitals with BMI  ?Height 5' 0.5"    ?Weight 205 lbs    ?BMI 39.36    ?  Systolic 104 146 546  ?Diastolic 72 83 88  ?Pulse 76 64 65  ? ? ?BP 104/72   Pulse 76   Ht 5' 0.5" (1.537 m)   Wt 205 lb (93 kg)   BMI 39.38 kg/m?   ?Wt Readings from Last 3 Encounters:  ?11/18/21 205 lb (93 kg)  ?07/01/11 192 lb (87.1 kg)  ?  ? ?Physical Exam ?Constitutional:   ?   Appearance: She is well-developed.  ?HENT:  ?   Head: Normocephalic and atraumatic.  ?Neck:  ?   Thyroid: No thyromegaly.  ?   Trachea: No tracheal deviation.  ?Cardiovascular:  ?   Rate and Rhythm: Normal rate and regular rhythm.  ?Pulmonary:  ?   Effort: Pulmonary effort is normal.  ?   Breath sounds: Normal breath sounds.  ?Abdominal:  ?   General: Bowel sounds are normal.  ?   Palpations: Abdomen is soft.  ?   Tenderness: There is no abdominal tenderness. There is no guarding.  ?Musculoskeletal:     ?   General: Normal range of motion.  ?   Cervical back: Normal range of motion and neck supple.  ?Skin: ?   General: Skin is warm and dry.  ?   Coloration: Skin is not pale.  ?   Findings: No erythema or rash.  ?Neurological:  ?   Mental Status: She is alert and oriented to person, place, and time.  ?   Cranial Nerves: No cranial nerve deficit.  ?   Coordination: Coordination normal.  ?   Deep Tendon Reflexes:  Reflexes are normal and symmetric.  ?Psychiatric:     ?   Judgment: Judgment normal.  ? ? ?Recent Results (from the past 2160 hour(s))  ?Basic metabolic panel     Status: None  ? Collection Time: 11/04/21 12:00 AM  ?Result V

## 2022-03-02 ENCOUNTER — Ambulatory Visit: Payer: 59 | Admitting: "Endocrinology

## 2022-03-07 ENCOUNTER — Encounter: Payer: Self-pay | Admitting: "Endocrinology

## 2022-03-07 ENCOUNTER — Ambulatory Visit: Payer: 59 | Admitting: "Endocrinology

## 2022-03-07 VITALS — BP 112/72 | HR 60 | Ht 60.5 in | Wt 200.8 lb

## 2022-03-07 DIAGNOSIS — E1165 Type 2 diabetes mellitus with hyperglycemia: Secondary | ICD-10-CM | POA: Diagnosis not present

## 2022-03-07 DIAGNOSIS — Z6838 Body mass index (BMI) 38.0-38.9, adult: Secondary | ICD-10-CM | POA: Diagnosis not present

## 2022-03-07 DIAGNOSIS — I10 Essential (primary) hypertension: Secondary | ICD-10-CM

## 2022-03-07 LAB — POCT GLYCOSYLATED HEMOGLOBIN (HGB A1C): HbA1c, POC (controlled diabetic range): 6.1 % (ref 0.0–7.0)

## 2022-03-07 NOTE — Patient Instructions (Signed)

## 2022-03-07 NOTE — Progress Notes (Signed)
Endocrinology Consult Note       03/07/2022, 1:35 PM   Subjective:    Patient ID: Julia Odom, female    DOB: 08-07-1976.  Julia Odom is being seen in consultation for management of currently uncontrolled symptomatic diabetes requested by  Cathie Olden, MD.   Past Medical History:  Diagnosis Date   Asthma    Diabetes mellitus, type II ([place])    Frequent headaches    Hx of colonic polyps    Hypertension    Mood disorder ([place])    Pregnancy induced hypertension     Past Surgical History:  Procedure Laterality Date   BREAST REDUCTION SURGERY     REDUCTION MAMMAPLASTY Bilateral 2008   WISDOM TOOTH EXTRACTION      Social History   Socioeconomic History   Marital status: Married    Spouse name: Not on file   Number of children: Not on file   Years of education: Not on file   Highest education level: Not on file  Occupational History   Not on file  Tobacco Use   Smoking status: Never   Smokeless tobacco: Not on file  Vaping Use   Vaping Use: Never used  Substance and Sexual Activity   Alcohol use: Yes    Comment: occasional but not while pregnant   Drug use: No   Sexual activity: Yes  Other Topics Concern   Not on file  Social History Narrative   Not on file   Social Determinants of Health   Financial Resource Strain: Not on file  Food Insecurity: Not on file  Transportation Needs: Not on file  Physical Activity: Not on file  Stress: Not on file  Social Connections: Not on file    Family History  Problem Relation Age of Onset   Diabetes Mother    Cancer Mother    Diabetes Father    Cancer Father     Outpatient Encounter Medications as of 03/07/2022  Medication Sig   acetaminophen (TYLENOL) 500 MG tablet Take 1,000 mg by mouth every 6 (six) hours as needed for mild pain.   albuterol (PROAIR HFA) 108 (90 BASE) MCG/ACT inhaler Inhale 2 puffs into  the lungs every 6 (six) hours as needed for wheezing or shortness of breath.   Cholecalciferol (VITAMIN D3 PO) Take 1 tablet by mouth daily in the afternoon.   Ciclesonide 37 MCG/ACT AERS Place into the nose.   ELDERBERRY PO Take 1 tablet by mouth daily in the afternoon.   fluticasone (FLONASE) 50 MCG/ACT nasal spray USE 1 TO 2 SPRAYS IN EACH NOSTRIL EVERY DAY   levocetirizine (XYZAL) 5 MG tablet Take 5 mg by mouth every evening.   losartan (COZAAR) 25 MG tablet Take 25 mg by mouth daily.   montelukast (SINGULAIR) 10 MG tablet Take 10 mg by mouth at bedtime.   Multiple Vitamin (MULTIVITAMIN ADULT PO) Take 1 tablet by mouth daily in the afternoon.   TRELEGY ELLIPTA 200-62.5-25 MCG/ACT AEPB Inhale 1 puff into the lungs daily.   [DISCONTINUED] albuterol (PROVENTIL HFA;VENTOLIN HFA) 108 (90 BASE) MCG/ACT inhaler Inhale 2 puffs into the lungs every 6 (six)  hours as needed for wheezing or shortness of breath.   [DISCONTINUED] glipiZIDE (GLUCOTROL XL) 5 MG 24 hr tablet Take 5 mg by mouth every morning. (Patient not taking: Reported on 11/18/2021)   [DISCONTINUED] hydrochlorothiazide (HYDRODIURIL) 25 MG tablet Take 25 mg by mouth daily.   [DISCONTINUED] ZINC OXIDE PO Take 1 tablet by mouth 2 (two) times a week.   No facility-administered encounter medications on file as of 03/07/2022.    ALLERGIES: Allergies  Allergen Reactions   Jardiance [Empagliflozin] Hives   Metformin And Related Nausea And Vomiting    VACCINATION STATUS: Immunization History  Administered Date(s) Administered   Tdap 07/02/2011    Diabetes She presents for her follow-up diabetic visit. She has type 2 diabetes mellitus. Onset time: She was diagnosed at approximate age of 43 years. Her disease course has been improving. There are no hypoglycemic associated symptoms. Pertinent negatives for hypoglycemia include no confusion, headaches, pallor or seizures. Pertinent negatives for diabetes include no chest pain, no fatigue, no  polydipsia, no polyphagia and no polyuria. There are no hypoglycemic complications. Symptoms are improving. There are no diabetic complications. Risk factors for coronary artery disease include obesity, sedentary lifestyle, diabetes mellitus, family history and hypertension. Current diabetic treatments: She did not tolerate Jardiance, metformin, Ozempic.  She was given a prescription for glipizide, however did not fill it. She is following a generally unhealthy diet. When asked about meal planning, she reported none. She has not had a previous visit with a dietitian. She never participates in exercise. Her home blood glucose trend is decreasing steadily. Her overall blood glucose range is 110-130 mg/dl. (She presents with significant improvement in her glycemic profile averaging 125 mg per DL.  Her point-of-care A1c 6.1% improving from 7.4% during her last visit.  She is responding to lifestyle medicine.  She is not on any medications at this time.   ) An ACE inhibitor/angiotensin II receptor blocker is being taken.  Hypertension This is a chronic problem. The current episode started more than 1 year ago. The problem is controlled. Pertinent negatives include no chest pain, headaches, palpitations or shortness of breath. Risk factors for coronary artery disease include diabetes mellitus, family history, obesity and sedentary lifestyle. Past treatments include angiotensin blockers.     Review of Systems  Constitutional:  Negative for chills, fatigue, fever and unexpected weight change.  HENT:  Negative for trouble swallowing and voice change.   Eyes:  Negative for visual disturbance.  Respiratory:  Negative for cough, shortness of breath and wheezing.   Cardiovascular:  Negative for chest pain, palpitations and leg swelling.  Gastrointestinal:  Negative for diarrhea, nausea and vomiting.  Endocrine: Negative for cold intolerance, heat intolerance, polydipsia, polyphagia and polyuria.  Musculoskeletal:   Negative for arthralgias and myalgias.  Skin:  Negative for color change, pallor, rash and wound.  Neurological:  Negative for seizures and headaches.  Psychiatric/Behavioral:  Negative for confusion and suicidal ideas.     Objective:       03/07/2022   10:02 AM 11/18/2021   10:35 AM 06/17/2014   12:18 PM  Vitals with BMI  Height 5' 0.5" 5' 0.5"   Weight 200 lbs 13 oz 205 lbs   BMI 38.56 39.36   Systolic 112 104 253  Diastolic 72 72 83  Pulse 60 76 64    BP 112/72   Pulse 60   Ht 5' 0.5" (1.537 m)   Wt 200 lb 12.8 oz (91.1 kg)   BMI 38.57 kg/m  Wt Readings from Last 3 Encounters:  03/07/22 200 lb 12.8 oz (91.1 kg)  11/18/21 205 lb (93 kg)  07/01/11 192 lb (87.1 kg)     Physical Exam Constitutional:      Appearance: She is well-developed.  HENT:     Head: Normocephalic and atraumatic.  Neck:     Thyroid: No thyromegaly.     Trachea: No tracheal deviation.  Cardiovascular:     Rate and Rhythm: Normal rate and regular rhythm.  Pulmonary:     Effort: Pulmonary effort is normal.     Breath sounds: Normal breath sounds.  Abdominal:     General: Bowel sounds are normal.     Palpations: Abdomen is soft.     Tenderness: There is no abdominal tenderness. There is no guarding.  Musculoskeletal:        General: Normal range of motion.     Cervical back: Normal range of motion and neck supple.  Skin:    General: Skin is warm and dry.     Coloration: Skin is not pale.     Findings: No erythema or rash.  Neurological:     Mental Status: She is alert and oriented to person, place, and time.     Cranial Nerves: No cranial nerve deficit.     Coordination: Coordination normal.     Deep Tendon Reflexes: Reflexes are normal and symmetric.  Psychiatric:        Judgment: Judgment normal.     Recent Results (from the past 2160 hour(s))  HgB A1c     Status: None   Collection Time: 03/07/22 10:18 AM  Result Value Ref Range   Hemoglobin A1C     HbA1c POC (<> result,  manual entry)     HbA1c, POC (prediabetic range)     HbA1c, POC (controlled diabetic range) 6.1 0.0 - 7.0 %   Lipid Panel     Component Value Date/Time   CHOL 135 06/04/2021 0000   TRIG 68 06/04/2021 0000   HDL 55 06/04/2021 0000   LDLCALC 67 06/04/2021 0000    Assessment & Plan:   1. Type 2 diabetes mellitus with hyperglycemia, without long-term current use of insulin (HCC)   - MARSELA COKLEY has currently uncontrolled symptomatic type 2 DM since  46 years of age. She presents with significant improvement in her glycemic profile averaging 125 mg per DL.  Her point-of-care A1c 6.1% improving from 7.4% during her last visit.  She is responding to lifestyle medicine.  She is not on any medications at this time.     - Recent labs reviewed. - I had a long discussion with her about the progressive nature of diabetes and the pathology behind its complications. -her diabetes is complicated by obesity/sedentary life, hypertension, and she remains at a high risk for more acute and chronic complications which include CAD, CVA, CKD, retinopathy, and neuropathy. These are all discussed in detail with her.  - I discussed all available options of managing her diabetes including de-escalation of medications. I have counseled her on diet  and weight management  by adopting a Whole Food , Plant Predominant  ( WFPP) nutrition as recommended by [employer] of Lifestyle Medicine. Patient is encouraged to switch to  unprocessed or minimally processed  complex starch, adequate protein intake (mainly plant source), minimal liquid fat ( mainly vegetable oils), plenty of fruits, and vegetables. -  she is advised to stick to a routine mealtimes to eat 3 complete meals a day  and snack only when necessary ( to snack only to correct hypoglycemia BG <70 day time or <100 at night).   -This patient is responding to lifestyle medicine.  She was not given any medications for diabetes during her last visit. -  she acknowledges that there is a room for improvement in her food and drink choices. - Suggestion is made for her to avoid simple carbohydrates  from her diet including Cakes, Sweet Desserts, Ice Cream, Soda (diet and regular), Sweet Tea, Candies, Chips, Cookies, Store Bought Juices, Alcohol , Artificial Sweeteners,  Coffee Creamer, and "Sugar-free" Products, Lemonade. This will help patient to have more stable blood glucose profile and potentially avoid unintended weight gain.  The following Lifestyle Medicine recommendations according to [facility]) were discussed and and offered to patient and she  agrees to start the journey:  A. Whole Foods, Plant-Based Nutrition comprising of fruits and vegetables, plant-based proteins, whole-grain carbohydrates was discussed in detail with the patient.   A list for source of those nutrients were also provided to the patient.  Patient will use only water or unsweetened tea for hydration. B.  The need to stay away from risky substances including alcohol, smoking; obtaining 7 to 9 hours of restorative sleep, at least 150 minutes of moderate intensity exercise weekly, the importance of healthy social connections,  and stress management techniques were discussed. C.  A full color page of  Calorie density of various food groups per pound showing examples of each food groups was provided to the patient.    - she will be scheduled with Jearld Fenton, RDN, CDE for individualized diabetes education.  - I have approached her with the following plan to manage  her diabetes and patient agrees:   -She seems to have engaged appropriately.  She will not be given any medications for diabetes at this time.  In light of her A1c is being 6.1%, she is advised to monitor blood glucose once a day before breakfast until next visit in 3 months.   - she is encouraged to call clinic for blood glucose levels less than 70 or above 200 mg /dl. -She  reports intolerance to Jardiance, metformin, and Ozempic.  She will be considered for options including glipizide if necessary during her next visit.  - Specific targets for  A1c;  LDL, HDL,  and Triglycerides were discussed with the patient.  2) Blood Pressure /Hypertension: Her blood pressure is controlled.  She will benefit from de-escalation.  She is advised to continue losartan 25 mg p.o. daily, however advised to discontinue hydrochlorothiazide.   3) Lipids/Hyperlipidemia:   Review of her recent lipid panel showed  controlled  LDL at 67 .  she  is not on statins.  The above detailed WF PB diet will help with managing lipids as well as hypertension.  She will have fasting lipid panel before next visit.  4)  Weight/Diet:  Body mass index is 38.57 kg/m.  -   clearly complicating her diabetes care.   she is  a candidate for weight loss. I discussed with her the fact that loss of 5 - 10% of her  current body weight will have the most impact on her diabetes management.  The above detailed  ACLM recommendations for nutrition, exercise, sleep, social life, avoidance of risky substances, the need for restorative sleep   information will also detailed on discharge instructions.  5) Chronic Care/Health Maintenance:  -she  is on [facility]  medications  and  is encouraged to initiate and continue to follow up with Ophthalmology, Dentist,  Podiatrist at least yearly or according to recommendations, and advised to   stay away from smoking. I have recommended yearly flu vaccine and pneumonia vaccine at least every 5 years; moderate intensity exercise for up to 150 minutes weekly; and  sleep for 7- 9 hours a day.  - she is  advised to maintain close follow up with Valla Leaver, MD for primary care needs, as well as her other providers for optimal and coordinated care.    I spent 41 minutes in the care of the patient today including review of labs from CMP, Lipids, Thyroid Function, Hematology (current  and previous including abstractions from other facilities); face-to-face time discussing  her blood glucose readings/logs, discussing hypoglycemia and hyperglycemia episodes and symptoms, medications doses, her options of short and long term treatment based on the latest standards of care / guidelines;  discussion about incorporating lifestyle medicine;  and documenting the encounter. Risk reduction counseling performed per USPSTF guidelines to reduce obesity and cardiovascular risk factors.     Please refer to Patient Instructions for Blood Glucose Monitoring and Insulin/Medications Dosing Guide"  in media tab for additional information. Please  also refer to " Patient Self Inventory" in the Media  tab for reviewed elements of pertinent patient history.  Darrelyn Hillock participated in the discussions, expressed understanding, and voiced agreement with the above plans.  All questions were answered to her satisfaction. she is encouraged to contact clinic should she have any questions or concerns prior to her return visit.    Follow up plan: - Return in about 3 months (around 06/07/2022) for F/U with Pre-visit Labs, Meter/CGM/Logs, A1c here.  Marquis Lunch, MD [facility] [address] [place], [place] [address] Phone: [phone]  Fax: [phone]    03/07/2022, 1:35 PM  This note was partially dictated with voice recognition software. Similar sounding words can be transcribed inadequately or may not  be corrected upon review.

## 2022-06-09 ENCOUNTER — Other Ambulatory Visit: Payer: Self-pay | Admitting: Obstetrics and Gynecology

## 2022-06-09 DIAGNOSIS — Z139 Encounter for screening, unspecified: Secondary | ICD-10-CM

## 2022-06-13 ENCOUNTER — Ambulatory Visit: Payer: 59 | Admitting: "Endocrinology

## 2022-06-23 ENCOUNTER — Ambulatory Visit: Payer: 59 | Admitting: "Endocrinology

## 2022-07-19 ENCOUNTER — Ambulatory Visit: Payer: 59 | Admitting: "Endocrinology

## 2022-07-29 ENCOUNTER — Ambulatory Visit
Admission: RE | Admit: 2022-07-29 | Discharge: 2022-07-29 | Disposition: A | Discharge status: Home / Self Care | Payer: 59 | Source: Ambulatory Visit | Attending: Obstetrics and Gynecology | Admitting: Obstetrics and Gynecology

## 2022-07-29 DIAGNOSIS — Z139 Encounter for screening, unspecified: Secondary | ICD-10-CM

## 2022-08-11 ENCOUNTER — Encounter: Payer: Self-pay | Admitting: "Endocrinology

## 2022-08-11 ENCOUNTER — Ambulatory Visit: Payer: 59 | Admitting: "Endocrinology

## 2022-08-11 VITALS — BP 116/78 | HR 64 | Ht 60.5 in | Wt 203.4 lb

## 2022-08-11 DIAGNOSIS — I10 Essential (primary) hypertension: Secondary | ICD-10-CM | POA: Diagnosis not present

## 2022-08-11 DIAGNOSIS — E1165 Type 2 diabetes mellitus with hyperglycemia: Secondary | ICD-10-CM | POA: Diagnosis not present

## 2022-08-11 DIAGNOSIS — Z6838 Body mass index (BMI) 38.0-38.9, adult: Secondary | ICD-10-CM

## 2022-08-11 NOTE — Patient Instructions (Signed)

## 2022-08-11 NOTE — Progress Notes (Signed)
08/11/2022, 4:28 PM  Endocrinology follow-up note   Subjective:    Patient ID: CRESCENT GOTHAM, female    DOB: 08-Jul-1976.  Julia Odom is being seen in consultation for management of currently uncontrolled symptomatic diabetes requested by  Valla Leaver, MD.   Past Medical History:  Diagnosis Date   Asthma    Diabetes mellitus, type II (HCC)    Frequent headaches    Hx of colonic polyps    Hypertension    Mood disorder (HCC)    Pregnancy induced hypertension     Past Surgical History:  Procedure Laterality Date   BREAST REDUCTION SURGERY     REDUCTION MAMMAPLASTY Bilateral 2008   WISDOM TOOTH EXTRACTION      Social History   Socioeconomic History   Marital status: Married    Spouse name: Not on file   Number of children: Not on file   Years of education: Not on file   Highest education level: Not on file  Occupational History   Not on file  Tobacco Use   Smoking status: Never   Smokeless tobacco: Not on file  Vaping Use   Vaping Use: Never used  Substance and Sexual Activity   Alcohol use: Yes    Comment: occasional but not while pregnant   Drug use: No   Sexual activity: Yes  Other Topics Concern   Not on file  Social History Narrative   Not on file   Social Determinants of Health   Financial Resource Strain: Not on file  Food Insecurity: Not on file  Transportation Needs: Not on file  Physical Activity: Not on file  Stress: Not on file  Social Connections: Not on file    Family History  Problem Relation Age of Onset   Diabetes Mother    Cancer Mother    Diabetes Father    Cancer Father    Breast cancer Neg Hx     Outpatient Encounter Medications as of 08/11/2022  Medication Sig   acetaminophen (TYLENOL) 500 MG tablet Take 1,000 mg by mouth every 6 (six) hours as needed for mild pain.   albuterol (PROAIR HFA) 108 (90 BASE) MCG/ACT  inhaler Inhale 2 puffs into the lungs every 6 (six) hours as needed for wheezing or shortness of breath.   Cholecalciferol (VITAMIN D3 PO) Take 1 tablet by mouth daily in the afternoon.   Ciclesonide 37 MCG/ACT AERS Place into the nose.   ELDERBERRY PO Take 1 tablet by mouth daily in the afternoon.   fluticasone (FLONASE) 50 MCG/ACT nasal spray USE 1 TO 2 SPRAYS IN EACH NOSTRIL EVERY DAY   levocetirizine (XYZAL) 5 MG tablet Take 5 mg by mouth every evening.   losartan (COZAAR) 25 MG tablet Take 25 mg by mouth daily.   montelukast (SINGULAIR) 10 MG tablet Take 10 mg by mouth at bedtime.   Multiple Vitamin (MULTIVITAMIN ADULT PO) Take 1 tablet by mouth daily in the afternoon.   TRELEGY ELLIPTA 200-62.5-25 MCG/ACT AEPB Inhale 1 puff into the lungs daily.   No facility-administered encounter medications on file as of 08/11/2022.    ALLERGIES: Allergies  Allergen Reactions   Jardiance [Empagliflozin] Hives   Metformin And Related Nausea And Vomiting    VACCINATION STATUS: Immunization History  Administered Date(s) Administered   Tdap 07/02/2011    Diabetes She presents for her follow-up diabetic visit. She has type 2 diabetes mellitus. Onset time: She was diagnosed at approximate age of 46 years. Her disease course has been worsening. There are no hypoglycemic associated symptoms. Pertinent negatives for hypoglycemia include no confusion, headaches, pallor or seizures. Pertinent negatives for diabetes include no chest pain, no fatigue, no polydipsia, no polyphagia and no polyuria. There are no hypoglycemic complications. Symptoms are worsening. There are no diabetic complications. Risk factors for coronary artery disease include obesity, sedentary lifestyle, diabetes mellitus, family history and hypertension. Current diabetic treatments: She did not tolerate Jardiance, metformin, Ozempic.  She was given a prescription for glipizide, however did not fill it. Her weight is increasing steadily.  She is following a generally unhealthy diet. When asked about meal planning, she reported none. She has not had a previous visit with a dietitian. She never participates in exercise. Her home blood glucose trend is increasing steadily. Her overall blood glucose range is 110-130 mg/dl. (She presents with slightly higher glycemic profile compared to last visit.  Her A1c is 6.8% increasing from 6.1%.  She did not document any hypoglycemia.  She is not on any medications.  ) An ACE inhibitor/angiotensin II receptor blocker is being taken.  Hypertension This is a chronic problem. The current episode started more than 1 year ago. The problem is controlled. Pertinent negatives include no chest pain, headaches, palpitations or shortness of breath. Risk factors for coronary artery disease include diabetes mellitus, family history, obesity and sedentary lifestyle. Past treatments include angiotensin blockers.     Review of Systems  Constitutional:  Negative for chills, fatigue, fever and unexpected weight change.  HENT:  Negative for trouble swallowing and voice change.   Eyes:  Negative for visual disturbance.  Respiratory:  Negative for cough, shortness of breath and wheezing.   Cardiovascular:  Negative for chest pain, palpitations and leg swelling.  Gastrointestinal:  Negative for diarrhea, nausea and vomiting.  Endocrine: Negative for cold intolerance, heat intolerance, polydipsia, polyphagia and polyuria.  Musculoskeletal:  Negative for arthralgias and myalgias.  Skin:  Negative for color change, pallor, rash and wound.  Neurological:  Negative for seizures and headaches.  Psychiatric/Behavioral:  Negative for confusion and suicidal ideas.     Objective:       08/11/2022   11:32 AM 03/07/2022   10:02 AM 11/18/2021   10:35 AM  Vitals with BMI  Height 5' 0.5" 5' 0.5" 5' 0.5"  Weight 203 lbs 6 oz 200 lbs 13 oz 205 lbs  BMI 39.05 38.56 39.36  Systolic 116 112 161104  Diastolic 78 72 72  Pulse 64  60 76    BP 116/78   Pulse 64   Ht 5' 0.5" (1.537 m)   Wt 203 lb 6.4 oz (92.3 kg)   BMI 39.07 kg/m   Wt Readings from Last 3 Encounters:  08/11/22 203 lb 6.4 oz (92.3 kg)  03/07/22 200 lb 12.8 oz (91.1 kg)  11/18/21 205 lb (93 kg)     Physical Exam Constitutional:      Appearance: She is well-developed.  HENT:     Head: Normocephalic and atraumatic.  Neck:     Thyroid: No thyromegaly.     Trachea: No tracheal deviation.  Cardiovascular:     Rate and Rhythm: Normal rate  and regular rhythm.  Pulmonary:     Effort: Pulmonary effort is normal.     Breath sounds: Normal breath sounds.  Abdominal:     General: Bowel sounds are normal.     Palpations: Abdomen is soft.     Tenderness: There is no abdominal tenderness. There is no guarding.  Musculoskeletal:        General: Normal range of motion.     Cervical back: Normal range of motion and neck supple.  Skin:    General: Skin is warm and dry.     Coloration: Skin is not pale.     Findings: No erythema or rash.  Neurological:     Mental Status: She is alert and oriented to person, place, and time.     Cranial Nerves: No cranial nerve deficit.     Coordination: Coordination normal.     Deep Tendon Reflexes: Reflexes are normal and symmetric.  Psychiatric:        Judgment: Judgment normal.     No results found for this or any previous visit (from the past 2160 hour(s)).  Lipid Panel     Component Value Date/Time   CHOL 135 06/04/2021 0000   TRIG 68 06/04/2021 0000   HDL 55 06/04/2021 0000   LDLCALC 67 06/04/2021 0000    Assessment & Plan:   1. Type 2 diabetes mellitus with hyperglycemia, without long-term current use of insulin (HCC)   - PABLO MATHURIN has currently uncontrolled symptomatic type 2 DM since  46 years of age. She presents with stable but higher glycemic profile than last visit.  Her point-of-care A1c is 6.8%.    - Recent labs reviewed. - I had a long discussion with her about the  progressive nature of diabetes and the pathology behind its complications. -her diabetes is complicated by obesity/sedentary life, hypertension, and she remains at a high risk for more acute and chronic complications which include CAD, CVA, CKD, retinopathy, and neuropathy. These are all discussed in detail with her.  - I discussed all available options of managing her diabetes including de-escalation of medications. I have counseled her on diet  and weight management  by adopting a Whole Food , Plant Predominant  ( WFPP) nutrition as recommended by [employer] of Lifestyle Medicine. Patient is encouraged to switch to  unprocessed or minimally processed  complex starch, adequate protein intake (mainly plant source), minimal liquid fat ( mainly vegetable oils), plenty of fruits, and vegetables. -  she is advised to stick to a routine mealtimes to eat 3 complete meals a day and snack only when necessary ( to snack only to correct hypoglycemia BG <70 day time or <100 at night).   -This patient is responding to lifestyle medicine.  She was not given any medications for diabetes during her last visit.  - she acknowledges that there is a room for improvement in her food and drink choices. - Suggestion is made for her to avoid simple carbohydrates  from her diet including Cakes, Sweet Desserts, Ice Cream, Soda (diet and regular), Sweet Tea, Candies, Chips, Cookies, Store Bought Juices, Alcohol , Artificial Sweeteners,  Coffee Creamer, and "Sugar-free" Products, Lemonade. This will help patient to have more stable blood glucose profile and potentially avoid unintended weight gain.  The following Lifestyle Medicine recommendations according to American College of Lifestyle Medicine  [facility]) were discussed and and offered to patient and she  agrees to start the journey:  A. Whole Foods, Plant-Based Nutrition comprising of  fruits and vegetables, plant-based proteins, whole-grain carbohydrates was discussed in  detail with the patient.   A list for source of those nutrients were also provided to the patient.  Patient will use only water or unsweetened tea for hydration. B.  The need to stay away from risky substances including alcohol, smoking; obtaining 7 to 9 hours of restorative sleep, at least 150 minutes of moderate intensity exercise weekly, the importance of healthy social connections,  and stress management techniques were discussed. C.  A full color page of  Calorie density of various food groups per pound showing examples of each food groups was provided to the patient.   - she will be scheduled with Norm Salt, RDN, CDE for individualized diabetes education.  - I have approached her with the following plan to manage  her diabetes and patient agrees:   -She is still motivated to avoid medications.  She wishes and plans to reengage in lifestyle medicine.  She is willing to monitor blood glucose twice a day-daily before breakfast and at bedtime.  She will be continued without medications at the next visit.     - she is encouraged to call clinic for blood glucose levels less than 70 or above 200 mg /dl. -She reports intolerance to Jardiance, metformin, and Ozempic.  She will be considered for options including glipizide if necessary during her next visit.  - Specific targets for  A1c;  LDL, HDL,  and Triglycerides were discussed with the patient.  2) Blood Pressure /Hypertension:  Her blood pressure is controlled to target. She will benefit from de-escalation.  She is advised to continue losartan 25 mg p.o. daily, however advised to discontinue hydrochlorothiazide.   3) Lipids/Hyperlipidemia:   Review of her recent lipid panel showed  controlled  LDL at 67 .  she is not on statins. The above detailed WF PB diet will help with managing lipids as well as hypertension.  She will have fasting lipid panel before next visit.  4)  Weight/Diet:  Body mass index is 39.07 kg/m.  -   clearly  complicating her diabetes care.   she is  a candidate for weight loss. I discussed with her the fact that loss of 5 - 10% of her  current body weight will have the most impact on her diabetes management.  The above detailed  ACLM recommendations for nutrition, exercise, sleep, social life, avoidance of risky substances, the need for restorative sleep   information will also detailed on discharge instructions.  5) Chronic Care/Health Maintenance:  -she  is on ARB  medications and  is encouraged to initiate and continue to follow up with Ophthalmology, Dentist,  Podiatrist at least yearly or according to recommendations, and advised to   stay away from smoking. I have recommended yearly flu vaccine and pneumonia vaccine at least every 5 years; moderate intensity exercise for up to 150 minutes weekly; and  sleep for 7- 9 hours a day.  - she is  advised to maintain close follow up with Valla Leaver, MD for primary care needs, as well as her other providers for optimal and coordinated care.   I spent 26 minutes in the care of the patient today including review of labs from CMP, Lipids, Thyroid Function, Hematology (current and previous including abstractions from other facilities); face-to-face time discussing  her blood glucose readings/logs, discussing hypoglycemia and hyperglycemia episodes and symptoms, medications doses, her options of short and long term treatment based on the latest standards of care /  guidelines;  discussion about incorporating lifestyle medicine;  and documenting the encounter. Risk reduction counseling performed per USPSTF guidelines to reduce  obesity and cardiovascular risk factors.     Please refer to Patient Instructions for Blood Glucose Monitoring and Insulin/Medications Dosing Guide"  in media tab for additional information. Please  also refer to " Patient Self Inventory" in the Media  tab for reviewed elements of pertinent patient history.  Julia Odom  participated in the discussions, expressed understanding, and voiced agreement with the above plans.  All questions were answered to her satisfaction. she is encouraged to contact clinic should she have any questions or concerns prior to her return visit.    Follow up plan: - Return in about 3 months (around 11/10/2022) for Bring Meter/CGM Device/Logs- A1c in Office.  Marquis Lunch, MD [facility] [address] [place], [place] [address] Phone: [phone]  Fax: [phone]    08/11/2022, 4:28 PM  This note was partially dictated with voice recognition software. Similar sounding words can be transcribed inadequately or may not  be corrected upon review.

## 2022-09-16 ENCOUNTER — Telehealth: Payer: Self-pay | Admitting: "Endocrinology

## 2022-09-16 NOTE — Telephone Encounter (Signed)
New message   The patient C/o increase blood sugar  1/22 --194 at 10:00 am  1/22-- 130 at 8:00 pm  1/23--128 at 11 am  1/23--169 at 8:00 pm  1/24-- 155 at 1:00 pm  1/24--156 at 10:00 pm  1/25--131 at 8:36 am  1/25--196 at 9:00 pm  1/26--118 at 8:40am

## 2022-11-15 ENCOUNTER — Encounter: Payer: Self-pay | Admitting: "Endocrinology

## 2022-11-15 ENCOUNTER — Ambulatory Visit: Payer: 59 | Admitting: "Endocrinology

## 2022-11-15 VITALS — BP 122/80 | HR 68 | Ht 60.5 in | Wt 203.6 lb

## 2022-11-15 DIAGNOSIS — E1165 Type 2 diabetes mellitus with hyperglycemia: Secondary | ICD-10-CM

## 2022-11-15 DIAGNOSIS — I10 Essential (primary) hypertension: Secondary | ICD-10-CM

## 2022-11-15 LAB — POCT GLYCOSYLATED HEMOGLOBIN (HGB A1C): HbA1c, POC (controlled diabetic range): 7 % (ref 0.0–7.0)

## 2022-11-15 MED ORDER — DAPAGLIFLOZIN PROPANEDIOL 5 MG PO TABS: 5.0000 mg | ORAL_TABLET | Freq: Every day | ORAL | 3 refills | Status: DC

## 2022-11-15 MED ORDER — FREESTYLE INSULINX TEST VI STRP: ORAL_STRIP | 2 refills | Status: DC

## 2022-11-15 NOTE — Progress Notes (Signed)
11/15/2022, 12:43 PM  Endocrinology follow-up note   Subjective:    Patient ID: Julia Odom, female    DOB: 1976/04/04.  Julia Odom is being seen in consultation for management of currently uncontrolled symptomatic diabetes requested by  Cathie Olden, MD.   Past Medical History:  Diagnosis Date   Asthma    Diabetes mellitus, type II ([place])    Frequent headaches    Hx of colonic polyps    Hypertension    Mood disorder ([place])    Pregnancy induced hypertension     Past Surgical History:  Procedure Laterality Date   BREAST REDUCTION SURGERY     REDUCTION MAMMAPLASTY Bilateral 2008   WISDOM TOOTH EXTRACTION      Social History   Socioeconomic History   Marital status: Married    Spouse name: Not on file   Number of children: Not on file   Years of education: Not on file   Highest education level: Not on file  Occupational History   Not on file  Tobacco Use   Smoking status: Never   Smokeless tobacco: Not on file  Vaping Use   Vaping Use: Never used  Substance and Sexual Activity   Alcohol use: Yes    Comment: occasional but not while pregnant   Drug use: No   Sexual activity: Yes  Other Topics Concern   Not on file  Social History Narrative   Not on file   Social Determinants of Health   Financial Resource Strain: Not on file  Food Insecurity: Not on file  Transportation Needs: Not on file  Physical Activity: Not on file  Stress: Not on file  Social Connections: Not on file    Family History  Problem Relation Age of Onset   Diabetes Mother    Cancer Mother    Diabetes Father    Cancer Father    Breast cancer Neg Hx     Outpatient Encounter Medications as of 11/15/2022  Medication Sig   dapagliflozin propanediol (FARXIGA) 5 MG TABS tablet Take 1 tablet (5 mg total) by mouth daily before breakfast.   glucose blood (FREESTYLE INSULINX TEST)  test strip Use as instructed   acetaminophen (TYLENOL) 500 MG tablet Take 1,000 mg by mouth every 6 (six) hours as needed for mild pain.   albuterol (PROAIR HFA) 108 (90 BASE) MCG/ACT inhaler Inhale 2 puffs into the lungs every 6 (six) hours as needed for wheezing or shortness of breath.   Cholecalciferol (VITAMIN D3 PO) Take 1 tablet by mouth daily in the afternoon.   Ciclesonide 37 MCG/ACT AERS Place into the nose.   ELDERBERRY PO Take 1 tablet by mouth daily in the afternoon.   fluticasone (FLONASE) 50 MCG/ACT nasal spray USE 1 TO 2 SPRAYS IN EACH NOSTRIL EVERY DAY   levocetirizine (XYZAL) 5 MG tablet Take 5 mg by mouth every evening.   losartan (COZAAR) 25 MG tablet Take 25 mg by mouth daily.   montelukast (SINGULAIR) 10 MG tablet Take 10 mg by mouth at bedtime.   Multiple Vitamin (MULTIVITAMIN ADULT PO) Take 1 tablet by mouth daily in  the afternoon.   TRELEGY ELLIPTA 200-62.5-25 MCG/ACT AEPB Inhale 1 puff into the lungs daily.   No facility-administered encounter medications on file as of 11/15/2022.    ALLERGIES: Allergies  Allergen Reactions   Jardiance [Empagliflozin] Hives   Metformin And Related Nausea And Vomiting    VACCINATION STATUS: Immunization History  Administered Date(s) Administered   Tdap 07/02/2011    Diabetes She presents for her follow-up diabetic visit. She has type 2 diabetes mellitus. Onset time: She was diagnosed at approximate age of 47 years. Her disease course has been stable. There are no hypoglycemic associated symptoms. Pertinent negatives for hypoglycemia include no confusion, headaches, pallor or seizures. Pertinent negatives for diabetes include no chest pain, no fatigue, no polydipsia, no polyphagia and no polyuria. There are no hypoglycemic complications. Symptoms are stable. There are no diabetic complications. Risk factors for coronary artery disease include obesity, sedentary lifestyle, diabetes mellitus, family history and hypertension. Current  diabetic treatments: She did not tolerate Jardiance, metformin, Ozempic.  She was given a prescription for glipizide, however did not fill it. Her weight is increasing steadily. She is following a generally unhealthy diet. When asked about meal planning, she reported none. She has not had a previous visit with a dietitian. She never participates in exercise. Her home blood glucose trend is fluctuating minimally. Her breakfast blood glucose range is generally 140-180 mg/dl. Her overall blood glucose range is 140-180 mg/dl. (She presents with  increasing glycemic profile and POC A1c is 7% progressively increasing from 6.1%.  She did not document any hypoglycemia.  She is not on any medications.  ) An ACE inhibitor/angiotensin II receptor blocker is being taken.  Hypertension This is a chronic problem. The current episode started more than 1 year ago. The problem is controlled. Pertinent negatives include no chest pain, headaches, palpitations or shortness of breath. Risk factors for coronary artery disease include diabetes mellitus, family history, obesity and sedentary lifestyle. Past treatments include angiotensin blockers.     Review of Systems  Constitutional:  Negative for chills, fatigue, fever and unexpected weight change.  HENT:  Negative for trouble swallowing and voice change.   Eyes:  Negative for visual disturbance.  Respiratory:  Negative for cough, shortness of breath and wheezing.   Cardiovascular:  Negative for chest pain, palpitations and leg swelling.  Gastrointestinal:  Negative for diarrhea, nausea and vomiting.  Endocrine: Negative for cold intolerance, heat intolerance, polydipsia, polyphagia and polyuria.  Musculoskeletal:  Negative for arthralgias and myalgias.  Skin:  Negative for color change, pallor, rash and wound.  Neurological:  Negative for seizures and headaches.  Psychiatric/Behavioral:  Negative for confusion and suicidal ideas.     Objective:       11/15/2022    10:04 AM 08/11/2022   11:32 AM 03/07/2022   10:02 AM  Vitals with BMI  Height 5' 0.5" 5' 0.5" 5' 0.5"  Weight 203 lbs 10 oz 203 lbs 6 oz 200 lbs 13 oz  BMI 39.09 [id number]  Systolic 123XX123 [id number]  Diastolic 80 78 72  Pulse 68 64 60    BP 122/80   Pulse 68   Ht 5' 0.5" (1.537 m)   Wt 203 lb 9.6 oz (92.4 kg)   BMI 39.11 kg/m   Wt Readings from Last 3 Encounters:  11/15/22 203 lb 9.6 oz (92.4 kg)  08/11/22 203 lb 6.4 oz (92.3 kg)  03/07/22 200 lb 12.8 oz (91.1 kg)       Recent Results (from the past  2160 hour(s))  HgB A1c     Status: None   Collection Time: 11/15/22 10:39 AM  Result Value Ref Range   Hemoglobin A1C     HbA1c POC (<> result, manual entry)     HbA1c, POC (prediabetic range)     HbA1c, POC (controlled diabetic range) 7.0 0.0 - 7.0 %    Lipid Panel     Component Value Date/Time   CHOL 135 06/04/2021 0000   TRIG 68 06/04/2021 0000   HDL 55 06/04/2021 0000   LDLCALC 67 06/04/2021 0000    Assessment & Plan:   1. Type 2 diabetes mellitus with hyperglycemia, without long-term current use of insulin ([place])   - BRYNDA DEWATER has currently uncontrolled symptomatic type 2 DM since  47 years of age. She presents with  increasing glycemic profile and POC A1c is 7% progressively increasing from 6.1%.  She did not document any hypoglycemia.  She is not on any medications.    - Recent labs reviewed. - I had a long discussion with her about the progressive nature of diabetes and the pathology behind its complications. -her diabetes is complicated by obesity/sedentary life, hypertension, and she remains at a high risk for more acute and chronic complications which include CAD, CVA, CKD, retinopathy, and neuropathy. These are all discussed in detail with her.  - I discussed all available options of managing her diabetes including de-escalation of medications. I have counseled her on diet  and weight management  by adopting a Whole Food , Plant Predominant  (  WFPP) nutrition as recommended by [employer] of Lifestyle Medicine. Patient is encouraged to switch to  unprocessed or minimally processed  complex starch, adequate protein intake (mainly plant source), minimal liquid fat ( mainly vegetable oils), plenty of fruits, and vegetables. -  she is advised to stick to a routine mealtimes to eat 3 complete meals a day and snack only when necessary ( to snack only to correct hypoglycemia BG <70 day time or <100 at night).   -This patient is responding to lifestyle medicine.  She was not given any medications for diabetes during her last visit.  - she acknowledges that there is a room for improvement in her food and drink choices. - Suggestion is made for her to avoid simple carbohydrates  from her diet including Cakes, Sweet Desserts, Ice Cream, Soda (diet and regular), Sweet Tea, Candies, Chips, Cookies, Store Bought Juices, Alcohol , Artificial Sweeteners,  Coffee Creamer, and "Sugar-free" Products, Lemonade. This will help patient to have more stable blood glucose profile and potentially avoid unintended weight gain.  The following Lifestyle Medicine recommendations according to [place]  [facility]) were discussed and and offered to patient and she  agrees to start the journey:  A. Whole Foods, Plant-Based Nutrition comprising of fruits and vegetables, plant-based proteins, whole-grain carbohydrates was discussed in detail with the patient.   A list for source of those nutrients were also provided to the patient.  Patient will use only water or unsweetened tea for hydration. B.  The need to stay away from risky substances including alcohol, smoking; obtaining 7 to 9 hours of restorative sleep, at least 150 minutes of moderate intensity exercise weekly, the importance of healthy social connections,  and stress management techniques were discussed. C.  A full color page of  Calorie density of various food groups per pound showing  examples of each food groups was provided to the patient.    -  she will be scheduled with Jearld Fenton, RDN, CDE for individualized diabetes education.  - I have approached her with the following plan to manage  her diabetes and patient agrees:   -She is still motivated to avoid medications.  She wishes and plans to reengage in lifestyle medicine.  She is willing to monitor blood glucose twice a day-daily before breakfast and at bedtime.    - she will like to try meds but not metformin.  -  she wishes to avoid injectable meds. I discussed and initiated low dose Farxiga for her to try.  -Side effects and precautions discussed with her.  She is willing to continue monitoring blood glucose-advised to monitor before breakfast and at bedtime.  She is encouraged to call clinic for blood glucose levels less than 70 or above 200 mg per DL at fasting.  - Specific targets for  A1c;  LDL, HDL,  and Triglycerides were discussed with the patient.  2) Blood Pressure /Hypertension:  --her blood pressure is controlled to target. She will benefit from de-escalation.  She is advised to continue losartan 25 mg p.o. daily, however advised to discontinue hydrochlorothiazide.   3) Lipids:   Review of her recent lipid panel showed  controlled  LDL at 67 .  she is not on statins. The above detailed WF PB diet will help with managing lipids as well as hypertension.  She will have fasting lipid panel before next visit.  4)  Weight/Diet:  Body mass index is 39.11 kg/m.  -   clearly complicating her diabetes care.   she is  a candidate for weight loss. I discussed with her the fact that loss of 5 - 10% of her  current body weight will have the most impact on her diabetes management.  The above detailed  ACLM recommendations for nutrition, exercise, sleep, social life, avoidance of risky substances, the need for restorative sleep   information will also detailed on discharge instructions.  5) Chronic Care/Health  Maintenance:  -she  is on ARB  medications and  is encouraged to initiate and continue to follow up with Ophthalmology, Dentist,  Podiatrist at least yearly or according to recommendations, and advised to   stay away from smoking. I have recommended yearly flu vaccine and pneumonia vaccine at least every 5 years; moderate intensity exercise for up to 150 minutes weekly; and  sleep for 7- 9 hours a day.  -Her diabetes foot exam is normal today.  - she is  advised to maintain close follow up with Cathie Olden, MD for primary care needs, as well as her other providers for optimal and coordinated care.  I spent  25  minutes in the care of the patient today including review of labs from [place], Lipids, Thyroid Function, Hematology (current and previous including abstractions from other facilities); face-to-face time discussing  her blood glucose readings/logs, discussing hypoglycemia and hyperglycemia episodes and symptoms, medications doses, her options of short and long term treatment based on the latest standards of care / guidelines;  discussion about incorporating lifestyle medicine;  and documenting the encounter. Risk reduction counseling performed per USPSTF guidelines to reduce  obesity and cardiovascular risk factors.     Please refer to Patient Instructions for Blood Glucose Monitoring and Insulin/Medications Dosing Guide"  in media tab for additional information. Please  also refer to " Patient Self Inventory" in the Media  tab for reviewed elements of pertinent patient history.  Julia Odom participated in the discussions, expressed understanding, and  voiced agreement with the above plans.  All questions were answered to her satisfaction. she is encouraged to contact clinic should she have any questions or concerns prior to her return visit.   Follow up plan: - Return in about 4 months (around 03/17/2023) for F/U with Pre-visit Labs, Meter/CGM/Logs, A1c here.  Glade Lloyd,  MD [facility] [address] [place], [place] [address] Phone: [phone]  Fax: [phone]    11/15/2022, 12:43 PM  This note was partially dictated with voice recognition software. Similar sounding words can be transcribed inadequately or may not  be corrected upon review.

## 2023-02-24 ENCOUNTER — Other Ambulatory Visit: Payer: Self-pay | Admitting: "Endocrinology

## 2023-02-24 MED ORDER — GLIPIZIDE ER 5 MG PO TB24: 5.0000 mg | ORAL_TABLET | Freq: Every day | ORAL | 1 refills | Status: DC

## 2023-03-21 ENCOUNTER — Encounter: Payer: Self-pay | Admitting: "Endocrinology

## 2023-03-21 ENCOUNTER — Ambulatory Visit: Payer: 59 | Admitting: "Endocrinology

## 2023-03-21 VITALS — BP 114/72 | HR 64 | Ht 60.5 in | Wt 201.2 lb

## 2023-03-21 DIAGNOSIS — I10 Essential (primary) hypertension: Secondary | ICD-10-CM

## 2023-03-21 DIAGNOSIS — Z6838 Body mass index (BMI) 38.0-38.9, adult: Secondary | ICD-10-CM

## 2023-03-21 DIAGNOSIS — E1165 Type 2 diabetes mellitus with hyperglycemia: Secondary | ICD-10-CM | POA: Diagnosis not present

## 2023-03-21 DIAGNOSIS — Z794 Long term (current) use of insulin: Secondary | ICD-10-CM

## 2023-03-21 LAB — POCT GLYCOSYLATED HEMOGLOBIN (HGB A1C): HbA1c, POC (controlled diabetic range): 10.7 % — AB (ref 0.0–7.0)

## 2023-03-21 MED ORDER — BD PEN NEEDLE NANO U/F 32G X 4 MM MISC: 1 refills | Status: DC

## 2023-03-21 MED ORDER — DAPAGLIFLOZIN PROPANEDIOL 5 MG PO TABS: 5.0000 mg | ORAL_TABLET | Freq: Every day | ORAL | 0 refills | Status: DC

## 2023-03-21 MED ORDER — TRESIBA FLEXTOUCH 200 UNIT/ML ~~LOC~~ SOPN: 20.0000 [IU] | PEN_INJECTOR | Freq: Every day | SUBCUTANEOUS | Status: DC

## 2023-03-21 NOTE — Progress Notes (Signed)
03/21/2023, 1:17 PM  Endocrinology follow-up note   Subjective:    Patient ID: Julia Odom, female    DOB: [birth year].  Darrelyn Hillock is being seen in consultation for management of currently uncontrolled symptomatic diabetes requested by  Valla Leaver, MD.   Past Medical History:  Diagnosis Date   Asthma    Diabetes mellitus, type II (HCC)    Frequent headaches    Hx of colonic polyps    Hypertension    Mood disorder (HCC)    Pregnancy induced hypertension     Past Surgical History:  Procedure Laterality Date   BREAST REDUCTION SURGERY     REDUCTION MAMMAPLASTY Bilateral 2008   WISDOM TOOTH EXTRACTION      Social History   Socioeconomic History   Marital status: Married    Spouse name: Not on file   Number of children: Not on file   Years of education: Not on file   Highest education level: Not on file  Occupational History   Not on file  Tobacco Use   Smoking status: Never   Smokeless tobacco: Not on file  Vaping Use   Vaping status: Never Used  Substance and Sexual Activity   Alcohol use: Yes    Comment: occasional but not while pregnant   Drug use: No   Sexual activity: Yes  Other Topics Concern   Not on file  Social History Narrative   Not on file   Social Determinants of Health   Financial Resource Strain: Not on file  Food Insecurity: Not on file  Transportation Needs: Not on file  Physical Activity: Not on file  Stress: Not on file  Social Connections: Unknown (03/10/2022)   Received from [facility]   Social Network    Social Network: Not on file    Family History  Problem Relation Age of Onset   Diabetes Mother    Cancer Mother    Diabetes Father    Cancer Father    Breast cancer Neg Hx     Outpatient Encounter Medications as of 03/21/2023  Medication Sig   insulin degludec (TRESIBA FLEXTOUCH) 200 UNIT/ML FlexTouch Pen  Inject 20 Units into the skin at bedtime.   Insulin Pen Needle (BD PEN NEEDLE NANO U/F) 32G X 4 MM MISC Use to inject insulin nightly   acetaminophen (TYLENOL) 500 MG tablet Take 1,000 mg by mouth every 6 (six) hours as needed for mild pain.   albuterol (PROAIR HFA) 108 (90 BASE) MCG/ACT inhaler Inhale 2 puffs into the lungs every 6 (six) hours as needed for wheezing or shortness of breath.   Cholecalciferol (VITAMIN D3 PO) Take 1 tablet by mouth daily in the afternoon.   Ciclesonide 37 MCG/ACT AERS Place into the nose.   dapagliflozin propanediol (FARXIGA) 5 MG TABS tablet Take 1 tablet (5 mg total) by mouth daily before breakfast.   ELDERBERRY PO Take 1 tablet by mouth daily in the afternoon.   fluticasone (FLONASE) 50 MCG/ACT nasal spray USE 1 TO 2 SPRAYS IN EACH NOSTRIL EVERY DAY   glipiZIDE (GLUCOTROL XL) 5 MG 24 hr tablet Take 1  tablet (5 mg total) by mouth daily with breakfast.   glucose blood (FREESTYLE INSULINX TEST) test strip Use as instructed   levocetirizine (XYZAL) 5 MG tablet Take 5 mg by mouth every evening.   losartan (COZAAR) 25 MG tablet Take 25 mg by mouth daily.   montelukast (SINGULAIR) 10 MG tablet Take 10 mg by mouth at bedtime.   Multiple Vitamin (MULTIVITAMIN ADULT PO) Take 1 tablet by mouth daily in the afternoon.   TRELEGY ELLIPTA 200-62.5-25 MCG/ACT AEPB Inhale 1 puff into the lungs daily.   [DISCONTINUED] dapagliflozin propanediol (FARXIGA) 5 MG TABS tablet Take 1 tablet (5 mg total) by mouth daily before breakfast. (Patient not taking: Reported on 03/21/2023)   No facility-administered encounter medications on file as of 03/21/2023.    ALLERGIES: Allergies  Allergen Reactions   Jardiance [Empagliflozin] Hives   Metformin And Related Nausea And Vomiting    VACCINATION STATUS: Immunization History  Administered Date(s) Administered   Tdap 07/02/2011    Diabetes She presents for her follow-up diabetic visit. She has type 2 diabetes mellitus. Onset time:  She was diagnosed at approximate age of 47 years. Her disease course has been worsening. There are no hypoglycemic associated symptoms. Pertinent negatives for hypoglycemia include no confusion, headaches, pallor or seizures. Pertinent negatives for diabetes include no chest pain, no fatigue, no polydipsia, no polyphagia and no polyuria. There are no hypoglycemic complications. Symptoms are worsening. There are no diabetic complications. Risk factors for coronary artery disease include obesity, sedentary lifestyle, diabetes mellitus, family history and hypertension. Current diabetic treatments: She did not tolerate Jardiance, metformin, Ozempic.  She was given a prescription for glipizide, however did not fill it. Her weight is fluctuating minimally. She is following a generally unhealthy diet. When asked about meal planning, she reported none. She has not had a previous visit with a dietitian. She never participates in exercise. Her home blood glucose trend is increasing steadily. Her breakfast blood glucose range is generally >200 mg/dl. Her overall blood glucose range is >200 mg/dl. (She presents with worsening glycemic profile, point-of-care A1c of 10.7% increasing from 7%.    ) An ACE inhibitor/angiotensin II receptor blocker is being taken.  Hypertension This is a chronic problem. The current episode started more than 1 year ago. The problem is controlled. Pertinent negatives include no chest pain, headaches, palpitations or shortness of breath. Risk factors for coronary artery disease include diabetes mellitus, family history, obesity and sedentary lifestyle. Past treatments include angiotensin blockers.     Review of Systems  Constitutional:  Negative for chills, fatigue, fever and unexpected weight change.  HENT:  Negative for trouble swallowing and voice change.   Eyes:  Negative for visual disturbance.  Respiratory:  Negative for cough, shortness of breath and wheezing.   Cardiovascular:   Negative for chest pain, palpitations and leg swelling.  Gastrointestinal:  Negative for diarrhea, nausea and vomiting.  Endocrine: Negative for cold intolerance, heat intolerance, polydipsia, polyphagia and polyuria.  Musculoskeletal:  Negative for arthralgias and myalgias.  Skin:  Negative for color change, pallor, rash and wound.  Neurological:  Negative for seizures and headaches.  Psychiatric/Behavioral:  Negative for confusion and suicidal ideas.     Objective:       03/21/2023   11:35 AM 11/15/2022   10:04 AM 08/11/2022   11:32 AM  Vitals with BMI  Height 5' 0.5" 5' 0.5" 5' 0.5"  Weight 201 lbs 3 oz 203 lbs 10 oz 203 lbs 6 oz  BMI 38.63 39.09 39.05  Systolic 114 122 540  Diastolic 72 80 78  Pulse 64 68 64    BP 114/72   Pulse 64   Ht 5' 0.5" (1.537 m)   Wt 201 lb 3.2 oz (91.3 kg)   BMI 38.65 kg/m   Wt Readings from Last 3 Encounters:  03/21/23 201 lb 3.2 oz (91.3 kg)  11/15/22 203 lb 9.6 oz (92.4 kg)  08/11/22 203 lb 6.4 oz (92.3 kg)       Recent Results (from the past 2160 hour(s))  HgB A1c     Status: Abnormal   Collection Time: 03/21/23 11:48 AM  Result Value Ref Range   Hemoglobin A1C     HbA1c POC (<> result, manual entry)     HbA1c, POC (prediabetic range)     HbA1c, POC (controlled diabetic range) 10.7 (A) 0.0 - 7.0 %    Lipid Panel     Component Value Date/Time   CHOL 135 06/04/2021 0000   TRIG 68 06/04/2021 0000   HDL 55 06/04/2021 0000   LDLCALC 67 06/04/2021 0000    Assessment & Plan:   1. Type 2 diabetes mellitus with hyperglycemia, without long-term current use of insulin (HCC)   - LADERRICKA MOLAND has currently uncontrolled symptomatic type 2 DM since  47 years of age.  She presents with worsening glycemic profile, point-of-care A1c of 10.7% increasing from 7%.   - Recent labs reviewed. - I had a long discussion with her about the progressive nature of diabetes and the pathology behind its complications. -her diabetes is  complicated by obesity/sedentary life, hypertension, and she remains at a high risk for more acute and chronic complications which include CAD, CVA, CKD, retinopathy, and neuropathy. These are all discussed in detail with her.  - I discussed all available options of managing her diabetes including de-escalation of medications. I have counseled her on diet  and weight management  by adopting a Whole Food , Plant Predominant  ( WFPP) nutrition as recommended by [employer] of Lifestyle Medicine. Patient is encouraged to switch to  unprocessed or minimally processed  complex starch, adequate protein intake (mainly plant source), minimal liquid fat ( mainly vegetable oils), plenty of fruits, and vegetables. -  she is advised to stick to a routine mealtimes to eat 3 complete meals a day and snack only when necessary ( to snack only to correct hypoglycemia BG <70 day time or <100 at night).   -This patient is responding to lifestyle medicine.  She was not given any medications for diabetes during her last visit.  - she acknowledges that there is a room for improvement in her food and drink choices. - Suggestion is made for her to avoid simple carbohydrates  from her diet including Cakes, Sweet Desserts, Ice Cream, Soda (diet and regular), Sweet Tea, Candies, Chips, Cookies, Store Bought Juices, Alcohol , Artificial Sweeteners,  Coffee Creamer, and "Sugar-free" Products, Lemonade. This will help patient to have more stable blood glucose profile and potentially avoid unintended weight gain.  The following Lifestyle Medicine recommendations according to American College of Lifestyle Medicine  [facility]) were discussed and and offered to patient and she  agrees to start the journey:  A. Whole Foods, Plant-Based Nutrition comprising of fruits and vegetables, plant-based proteins, whole-grain carbohydrates was discussed in detail with the patient.   A list for source of those nutrients were also provided to the  patient.  Patient will use only water or unsweetened tea for hydration. B.  The  need to stay away from risky substances including alcohol, smoking; obtaining 7 to 9 hours of restorative sleep, at least 150 minutes of moderate intensity exercise weekly, the importance of healthy social connections,  and stress management techniques were discussed. C.  A full color page of  Calorie density of various food groups per pound showing examples of each food groups was provided to the patient.   - she will be scheduled with Norm Salt, RDN, CDE for individualized diabetes education.  - I have approached her with the following plan to manage  her diabetes and patient agrees:   -In light of her presentation with severe, persistent hypoglycemia, she is approached for basal insulin and she accepts.  I discussed and prescribed Tresiba 20 units nightly, associated with monitoring of blood glucose 4 times a day-before meals and at bedtime and return with a log centimeters in 3 weeks.    She is encouraged to call clinic for blood glucose levels less than 70 or above 200 mg per DL at fasting.    -She is advised to  resume 5 mg p.o. daily at breakfast along with glipizide 5 mg XL p.o. daily at breakfast.  She does not tolerate metformin.     - Specific targets for  A1c;  LDL, HDL,  and Triglycerides were discussed with the patient.  2) Blood Pressure /Hypertension:  -Her blood pressure is controlled to target. She 18/60  She will benefit from de-escalation.  She is advised to continue losartan 25 mg p.o. daily, however advised to discontinue hydrochlorothiazide.   3) Lipids:   Review of her recent lipid panel showed  controlled  LDL at 67 .  she is not on statins. The above detailed WF PB diet will help with managing lipids as well as hypertension.  She will have fasting lipid panel before next visit.  4)  Weight/Diet:  Body mass index is 38.65 kg/m.  -   clearly complicating her diabetes care.   she is   a candidate for weight loss. I discussed with her the fact that loss of 5 - 10% of her  current body weight will have the most impact on her diabetes management.  The above detailed  ACLM recommendations for nutrition, exercise, sleep, social life, avoidance of risky substances, the need for restorative sleep   information will also detailed on discharge instructions.  5) Chronic Care/Health Maintenance:  -she  is on ARB  medications and  is encouraged to initiate and continue to follow up with Ophthalmology, Dentist,  Podiatrist at least yearly or according to recommendations, and advised to   stay away from smoking. I have recommended yearly flu vaccine and pneumonia vaccine at least every 5 years; moderate intensity exercise for up to 150 minutes weekly; and  sleep for 7- 9 hours a day.  -Her diabetes foot exam is normal today.  - she is  advised to maintain close follow up with Valla Leaver, MD for primary care needs, as well as her other providers for optimal and coordinated care.   I spent  26  minutes in the care of the patient today including review of labs from CMP, Lipids, Thyroid Function, Hematology (current and previous including abstractions from other facilities); face-to-face time discussing  her blood glucose readings/logs, discussing hypoglycemia and hyperglycemia episodes and symptoms, medications doses, her options of short and long term treatment based on the latest standards of care / guidelines;  discussion about incorporating lifestyle medicine;  and documenting the encounter. Risk  reduction counseling performed per USPSTF guidelines to reduce  obesity and cardiovascular risk factors.     Please refer to Patient Instructions for Blood Glucose Monitoring and Insulin/Medications Dosing Guide"  in media tab for additional information. Please  also refer to " Patient Self Inventory" in the Media  tab for reviewed elements of pertinent patient history.  Darrelyn Hillock participated in the discussions, expressed understanding, and voiced agreement with the above plans.  All questions were answered to her satisfaction. she is encouraged to contact clinic should she have any questions or concerns prior to her return visit.   Follow up plan: - Return in about 3 weeks (around 04/11/2023) for F/U with Meter/CGM /Logs Only - no Labs.  Marquis Lunch, MD [facility] [address] [place], [place] [address] Phone: [phone]  Fax: [phone]    03/21/2023, 1:17 PM  This note was partially dictated with voice recognition software. Similar sounding words can be transcribed inadequately or may not  be corrected upon review.

## 2023-03-21 NOTE — Patient Instructions (Signed)
                                     Advice for Weight Management  -For most of us the best way to lose weight is by diet management. Generally speaking, diet management means consuming less calories intentionally which over time brings about progressive weight loss.  This can be achieved more effectively by avoiding ultra processed carbohydrates, processed meats, unhealthy fats.    It is critically important to know your numbers: how much calorie you are consuming and how much calorie you need. More importantly, our carbohydrates sources should be unprocessed naturally occurring  complex starch food items.  It is always important to balance nutrition also by  appropriate intake of proteins (mainly plant-based), healthy fats/oils, plenty of fruits and vegetables.   -The American College of Lifestyle Medicine (ACL M) recommends nutrition derived mostly from Whole Food, Plant Predominant Sources example an apple instead of applesauce or apple pie. Eat Plenty of vegetables, Mushrooms, fruits, Legumes, Whole Grains, Nuts, seeds in lieu of processed meats, processed snacks/pastries red meat, poultry, eggs.  Use only water or unsweetened tea for hydration.  The College also recommends the need to stay away from risky substances including alcohol, smoking; obtaining 7-9 hours of restorative sleep, at least 150 minutes of moderate intensity exercise weekly, importance of healthy social connections, and being mindful of stress and seek help when it is overwhelming.    -Sticking to a routine mealtime to eat 3 meals a day and avoiding unnecessary snacks is shown to have a big role in weight control. Under normal circumstances, the only time we burn stored energy is when we are hungry, so allow  some hunger to take place- hunger means no food between appropriate meal times, only water.  It is not advisable to starve.   -It is better to avoid simple carbohydrates including:  Cakes, Sweet Desserts, Ice Cream, Soda (diet and regular), Sweet Tea, Candies, Chips, Cookies, Store Bought Juices, Alcohol in Excess of  1-2 drinks a day, Lemonade,  Artificial Sweeteners, Doughnuts, Coffee Creamers, "Sugar-free" Products, etc, etc.  This is not a complete list.....    -Consulting with certified diabetes educators is proven to provide you with the most accurate and current information on diet.  Also, you may be  interested in discussing diet options/exchanges , we can schedule a visit with Julia Odom, RDN, CDE for individualized nutrition education.  -Exercise: If you are able: 30 -60 minutes a day ,4 days a week, or 150 minutes of moderate intensity exercise weekly.    The longer the better if tolerated.  Combine stretch, strength, and aerobic activities.  If you were told in the past that you have high risk for cardiovascular diseases, or if you are currently symptomatic, you may seek evaluation by your heart doctor prior to initiating moderate to intense exercise programs.                                  Additional Care Considerations for Diabetes/Prediabetes   -Diabetes  is a chronic disease.  The most important care consideration is regular follow-up with your diabetes care provider with the goal being avoiding or delaying its complications and to take advantage of advances in medications and technology.  If appropriate actions are taken early enough, type 2 diabetes can even be   reversed.  Seek information from the right source.  - Whole Food, Plant Predominant Nutrition is highly recommended: Eat Plenty of vegetables, Mushrooms, fruits, Legumes, Whole Grains, Nuts, seeds in lieu of processed meats, processed snacks/pastries red meat, poultry, eggs as recommended by American College of  Lifestyle Medicine (ACLM).  -Type 2 diabetes is known to coexist with other important comorbidities such as high blood pressure and high cholesterol.  It is critical to control not only the  diabetes but also the high blood pressure and high cholesterol to minimize and delay the risk of complications including coronary artery disease, stroke, amputations, blindness, etc.  The good news is that this diet recommendation for type 2 diabetes is also very helpful for managing high cholesterol and high blood blood pressure.  - Studies showed that people with diabetes will benefit from a class of medications known as ACE inhibitors and statins.  Unless there are specific reasons not to be on these medications, the standard of care is to consider getting one from these groups of medications at an optimal doses.  These medications are generally considered safe and proven to help protect the heart and the kidneys.    - People with diabetes are encouraged to initiate and maintain regular follow-up with eye doctors, foot doctors, dentists , and if necessary heart and kidney doctors.     - It is highly recommended that people with diabetes quit smoking or stay away from smoking, and get yearly  flu vaccine and pneumonia vaccine at least every 5 years.  See above for additional recommendations on exercise, sleep, stress management , and healthy social connections.      

## 2023-04-18 ENCOUNTER — Ambulatory Visit: Payer: 59 | Admitting: "Endocrinology

## 2023-04-26 ENCOUNTER — Ambulatory Visit: Payer: 59 | Admitting: "Endocrinology

## 2023-04-26 ENCOUNTER — Encounter: Payer: Self-pay | Admitting: "Endocrinology

## 2023-04-26 VITALS — BP 120/78 | HR 64 | Ht 60.5 in | Wt 203.8 lb

## 2023-04-26 DIAGNOSIS — I10 Essential (primary) hypertension: Secondary | ICD-10-CM | POA: Diagnosis not present

## 2023-04-26 DIAGNOSIS — E1165 Type 2 diabetes mellitus with hyperglycemia: Secondary | ICD-10-CM | POA: Diagnosis not present

## 2023-04-26 DIAGNOSIS — Z6838 Body mass index (BMI) 38.0-38.9, adult: Secondary | ICD-10-CM

## 2023-04-26 DIAGNOSIS — Z794 Long term (current) use of insulin: Secondary | ICD-10-CM

## 2023-04-26 MED ORDER — DAPAGLIFLOZIN PROPANEDIOL 5 MG PO TABS: 5.0000 mg | ORAL_TABLET | Freq: Every day | ORAL | 1 refills | Status: DC

## 2023-04-26 MED ORDER — ACCU-CHEK GUIDE ME W/DEVICE KIT: 1.0000 | PACK | 0 refills | Status: AC

## 2023-04-26 MED ORDER — INSULIN PEN NEEDLE 31G X 5 MM MISC: 1 refills | Status: AC

## 2023-04-26 MED ORDER — ACCU-CHEK GUIDE VI STRP: ORAL_STRIP | 2 refills | Status: AC

## 2023-04-26 MED ORDER — TRESIBA FLEXTOUCH 200 UNIT/ML ~~LOC~~ SOPN: 26.0000 [IU] | PEN_INJECTOR | Freq: Every day | SUBCUTANEOUS | 1 refills | Status: DC

## 2023-04-26 NOTE — Progress Notes (Signed)
04/26/2023, 2:17 PM  Endocrinology follow-up note   Subjective:    Patient ID: Julia Odom, female    DOB: Nov 04, 1975.  Darrelyn Hillock is being seen in consultation for management of currently uncontrolled symptomatic diabetes requested by  Valla Leaver, MD.   Past Medical History:  Diagnosis Date   Asthma    Diabetes mellitus, type II (HCC)    Frequent headaches    Hx of colonic polyps    Hypertension    Mood disorder (HCC)    Pregnancy induced hypertension     Past Surgical History:  Procedure Laterality Date   BREAST REDUCTION SURGERY     REDUCTION MAMMAPLASTY Bilateral 2008   WISDOM TOOTH EXTRACTION      Social History   Socioeconomic History   Marital status: Married    Spouse name: Not on file   Number of children: Not on file   Years of education: Not on file   Highest education level: Not on file  Occupational History   Not on file  Tobacco Use   Smoking status: Never   Smokeless tobacco: Not on file  Vaping Use   Vaping status: Never Used  Substance and Sexual Activity   Alcohol use: Yes    Comment: occasional but not while pregnant   Drug use: No   Sexual activity: Yes  Other Topics Concern   Not on file  Social History Narrative   Not on file   Social Determinants of Health   Financial Resource Strain: Not on file  Food Insecurity: Not on file  Transportation Needs: Not on file  Physical Activity: Not on file  Stress: Not on file  Social Connections: Unknown (03/10/2022)   Received from [facility]   Social Network    Social Network: Not on file    Family History  Problem Relation Age of Onset   Diabetes Mother    Cancer Mother    Diabetes Father    Cancer Father    Breast cancer Neg Hx     Outpatient Encounter Medications as of 04/26/2023  Medication Sig   Blood Glucose Monitoring Suppl (ACCU-CHEK GUIDE ME) w/Device KIT 1  Piece by Does not apply route as directed.   glucose blood (ACCU-CHEK GUIDE) test strip Use as instructed   Insulin Pen Needle 31G X 5 MM MISC Use to inject insulin   acetaminophen (TYLENOL) 500 MG tablet Take 1,000 mg by mouth every 6 (six) hours as needed for mild pain.   albuterol (PROAIR HFA) 108 (90 BASE) MCG/ACT inhaler Inhale 2 puffs into the lungs every 6 (six) hours as needed for wheezing or shortness of breath.   Cholecalciferol (VITAMIN D3 PO) Take 1 tablet by mouth daily in the afternoon.   Ciclesonide 37 MCG/ACT AERS Place into the nose.   dapagliflozin propanediol (FARXIGA) 5 MG TABS tablet Take 1 tablet (5 mg total) by mouth daily before breakfast.   ELDERBERRY PO Take 1 tablet by mouth daily in the afternoon.   fluticasone (FLONASE) 50 MCG/ACT nasal spray USE 1 TO 2 SPRAYS IN EACH NOSTRIL EVERY DAY   glipiZIDE (GLUCOTROL XL)  5 MG 24 hr tablet Take 1 tablet (5 mg total) by mouth daily with breakfast.   insulin degludec (TRESIBA FLEXTOUCH) 200 UNIT/ML FlexTouch Pen Inject 26 Units into the skin at bedtime.   levocetirizine (XYZAL) 5 MG tablet Take 5 mg by mouth every evening.   losartan (COZAAR) 25 MG tablet Take 25 mg by mouth daily.   montelukast (SINGULAIR) 10 MG tablet Take 10 mg by mouth at bedtime.   Multiple Vitamin (MULTIVITAMIN ADULT PO) Take 1 tablet by mouth daily in the afternoon.   TRELEGY ELLIPTA 200-62.5-25 MCG/ACT AEPB Inhale 1 puff into the lungs daily.   [DISCONTINUED] dapagliflozin propanediol (FARXIGA) 5 MG TABS tablet Take 1 tablet (5 mg total) by mouth daily before breakfast.   [DISCONTINUED] glucose blood (FREESTYLE INSULINX TEST) test strip Use as instructed   [DISCONTINUED] insulin degludec (TRESIBA FLEXTOUCH) 200 UNIT/ML FlexTouch Pen Inject 20 Units into the skin at bedtime.   [DISCONTINUED] Insulin Pen Needle (BD PEN NEEDLE NANO U/F) 32G X 4 MM MISC Use to inject insulin nightly   No facility-administered encounter medications on file as of 04/26/2023.     ALLERGIES: Allergies  Allergen Reactions   Jardiance [Empagliflozin] Hives   Metformin And Related Nausea And Vomiting    VACCINATION STATUS: Immunization History  Administered Date(s) Administered   Tdap 07/02/2011    Diabetes She presents for her follow-up diabetic visit. She has type 2 diabetes mellitus. Onset time: She was diagnosed at approximate age of 43 years. Her disease course has been improving. There are no hypoglycemic associated symptoms. Pertinent negatives for hypoglycemia include no confusion, headaches, pallor or seizures. Pertinent negatives for diabetes include no chest pain, no fatigue, no polydipsia, no polyphagia and no polyuria. There are no hypoglycemic complications. Symptoms are improving. There are no diabetic complications. Risk factors for coronary artery disease include obesity, sedentary lifestyle, diabetes mellitus, family history and hypertension. Current diabetic treatments: She did not tolerate Jardiance, metformin, Ozempic.  She was given a prescription for glipizide, however did not fill it. Her weight is fluctuating minimally. She is following a generally unhealthy diet. When asked about meal planning, she reported none. She has not had a previous visit with a dietitian. She never participates in exercise. Her home blood glucose trend is decreasing steadily. Her breakfast blood glucose range is generally 140-180 mg/dl. Her bedtime blood glucose range is generally 140-180 mg/dl. Her overall blood glucose range is 140-180 mg/dl. (She presents with improving glycemic profile averaging 145-187.  She has no hypoglycemia documented or reported.  Her most recent A1c was 10.7%.    ) An ACE inhibitor/angiotensin II receptor blocker is being taken.  Hypertension This is a chronic problem. The current episode started more than 1 year ago. The problem is controlled. Pertinent negatives include no chest pain, headaches, palpitations or shortness of breath. Risk  factors for coronary artery disease include diabetes mellitus, family history, obesity and sedentary lifestyle. Past treatments include angiotensin blockers.     Review of Systems  Constitutional:  Negative for chills, fatigue, fever and unexpected weight change.  HENT:  Negative for trouble swallowing and voice change.   Eyes:  Negative for visual disturbance.  Respiratory:  Negative for cough, shortness of breath and wheezing.   Cardiovascular:  Negative for chest pain, palpitations and leg swelling.  Gastrointestinal:  Negative for diarrhea, nausea and vomiting.  Endocrine: Negative for cold intolerance, heat intolerance, polydipsia, polyphagia and polyuria.  Musculoskeletal:  Negative for arthralgias and myalgias.  Skin:  Negative for color  change, pallor, rash and wound.  Neurological:  Negative for seizures and headaches.  Psychiatric/Behavioral:  Negative for confusion and suicidal ideas.     Objective:       04/26/2023    1:57 PM 03/21/2023   11:35 AM 11/15/2022   10:04 AM  Vitals with BMI  Height 5' 0.5" 5' 0.5" 5' 0.5"  Weight 203 lbs 13 oz 201 lbs 3 oz 203 lbs 10 oz  BMI 39.13 38.63 39.09  Systolic 120 114 244  Diastolic 78 72 80  Pulse 64 64 68    BP 120/78   Pulse 64   Ht 5' 0.5" (1.537 m)   Wt 203 lb 12.8 oz (92.4 kg)   BMI 39.15 kg/m   Wt Readings from Last 3 Encounters:  04/26/23 203 lb 12.8 oz (92.4 kg)  03/21/23 201 lb 3.2 oz (91.3 kg)  11/15/22 203 lb 9.6 oz (92.4 kg)       Recent Results (from the past 2160 hour(s))  HgB A1c     Status: Abnormal   Collection Time: 03/21/23 11:48 AM  Result Value Ref Range   Hemoglobin A1C     HbA1c POC (<> result, manual entry)     HbA1c, POC (prediabetic range)     HbA1c, POC (controlled diabetic range) 10.7 (A) 0.0 - 7.0 %    Lipid Panel     Component Value Date/Time   CHOL 135 06/04/2021 0000   TRIG 68 06/04/2021 0000   HDL 55 06/04/2021 0000   LDLCALC 67 06/04/2021 0000    Assessment & Plan:    1. Type 2 diabetes mellitus with hyperglycemia, without long-term current use of insulin (HCC)   - KATLYNNE GRANNEMAN has currently uncontrolled symptomatic type 2 DM since  47 years of age.  She presents with improving glycemic profile averaging 145-187.  She has no hypoglycemia documented or reported.  Her most recent A1c was 10.7%. - Recent labs reviewed. - I had a long discussion with her about the progressive nature of diabetes and the pathology behind its complications. -her diabetes is complicated by obesity/sedentary life, hypertension, and she remains at a high risk for more acute and chronic complications which include CAD, CVA, CKD, retinopathy, and neuropathy. These are all discussed in detail with her.  - I discussed all available options of managing her diabetes including de-escalation of medications. I have counseled her on diet  and weight management  by adopting a Whole Food , Plant Predominant  ( WFPP) nutrition as recommended by [employer] of Lifestyle Medicine. Patient is encouraged to switch to  unprocessed or minimally processed  complex starch, adequate protein intake (mainly plant source), minimal liquid fat ( mainly vegetable oils), plenty of fruits, and vegetables. -  she is advised to stick to a routine mealtimes to eat 3 complete meals a day and snack only when necessary ( to snack only to correct hypoglycemia BG <70 day time or <100 at night).   -This patient is responding to lifestyle medicine.  She was not given any medications for diabetes during her last visit.  - she acknowledges that there is a room for improvement in her food and drink choices. - Suggestion is made for her to avoid simple carbohydrates  from her diet including Cakes, Sweet Desserts, Ice Cream, Soda (diet and regular), Sweet Tea, Candies, Chips, Cookies, Store Bought Juices, Alcohol , Artificial Sweeteners,  Coffee Creamer, and "Sugar-free" Products, Lemonade. This will help patient to  have more stable blood glucose  profile and potentially avoid unintended weight gain.  The following Lifestyle Medicine recommendations according to American College of Lifestyle Medicine  [facility]) were discussed and and offered to patient and she  agrees to start the journey:  A. Whole Foods, Plant-Based Nutrition comprising of fruits and vegetables, plant-based proteins, whole-grain carbohydrates was discussed in detail with the patient.   A list for source of those nutrients were also provided to the patient.  Patient will use only water or unsweetened tea for hydration. B.  The need to stay away from risky substances including alcohol, smoking; obtaining 7 to 9 hours of restorative sleep, at least 150 minutes of moderate intensity exercise weekly, the importance of healthy social connections,  and stress management techniques were discussed. C.  A full color page of  Calorie density of various food groups per pound showing examples of each food groups was provided to the patient.   - she will be scheduled with Norm Salt, RDN, CDE for individualized diabetes education.  - I have approached her with the following plan to manage  her diabetes and patient agrees:   -In light of her presentation with improving glycemic profile, she will not need prandial insulin for now.  However, she will continue 20 mg insulin.  I discussed and increase her Evaristo Bury to 26 units nightly  associated with monitoring of blood glucose 4 times a day-before meals and at bedtime.   She is encouraged to call clinic for blood glucose levels less than 70 or above 200 mg per DL at fasting.    -She is advised to  resume Farxiga at 5 mg p.o. daily at breakfast along with her glipizide 5 mg XL p.o. daily.  She does not tolerate metformin.     - Specific targets for  A1c;  LDL, HDL,  and Triglycerides were discussed with the patient.  2) Blood Pressure /Hypertension:  Her blood pressure is controlled to target.   She is  advised to continue losartan 25 mg p.o. daily, however advised to discontinue hydrochlorothiazide.   3) Lipids:   Review of her recent lipid panel showed  controlled  LDL at 67 .  she is not on statins. The above detailed WF PB diet will help with managing lipids as well as hypertension.  She will have fasting lipid panel before next visit.  4)  Weight/Diet:  Body mass index is 39.15 kg/m.  -   clearly complicating her diabetes care.   she is  a candidate for weight loss. I discussed with her the fact that loss of 5 - 10% of her  current body weight will have the most impact on her diabetes management.  The above detailed  ACLM recommendations for nutrition, exercise, sleep, social life, avoidance of risky substances, the need for restorative sleep   information will also detailed on discharge instructions.  5) Chronic Care/Health Maintenance:  -she  is on ARB  medications and  is encouraged to initiate and continue to follow up with Ophthalmology, Dentist,  Podiatrist at least yearly or according to recommendations, and advised to   stay away from smoking. I have recommended yearly flu vaccine and pneumonia vaccine at least every 5 years; moderate intensity exercise for up to 150 minutes weekly; and  sleep for 7- 9 hours a day.  -Her diabetes foot exam is normal today.  - she is  advised to maintain close follow up with Valla Leaver, MD for primary care needs, as well as her other providers for  optimal and coordinated care.  I spent  26  minutes in the care of the patient today including review of labs from CMP, Lipids, Thyroid Function, Hematology (current and previous including abstractions from other facilities); face-to-face time discussing  her blood glucose readings/logs, discussing hypoglycemia and hyperglycemia episodes and symptoms, medications doses, her options of short and long term treatment based on the latest standards of care / guidelines;  discussion about incorporating  lifestyle medicine;  and documenting the encounter. Risk reduction counseling performed per USPSTF guidelines to reduce  obesity and cardiovascular risk factors.     Please refer to Patient Instructions for Blood Glucose Monitoring and Insulin/Medications Dosing Guide"  in media tab for additional information. Please  also refer to " Patient Self Inventory" in the Media  tab for reviewed elements of pertinent patient history.  Darrelyn Hillock participated in the discussions, expressed understanding, and voiced agreement with the above plans.  All questions were answered to her satisfaction. she is encouraged to contact clinic should she have any questions or concerns prior to her return visit.   Follow up plan: - Return in about 9 weeks (around 06/28/2023) for F/U with Pre-visit Labs, Meter/CGM/Logs, A1c here.  Marquis Lunch, MD [facility] [address] [place], [place] [address] Phone: [phone]  Fax: [phone]    04/26/2023, 2:17 PM  This note was partially dictated with voice recognition software. Similar sounding words can be transcribed inadequately or may not  be corrected upon review.

## 2023-04-26 NOTE — Patient Instructions (Signed)

## 2023-04-27 ENCOUNTER — Other Ambulatory Visit: Payer: Self-pay | Admitting: "Endocrinology

## 2023-04-27 ENCOUNTER — Telehealth: Payer: Self-pay

## 2023-04-27 MED ORDER — EMPAGLIFLOZIN 10 MG PO TABS: 10.0000 mg | ORAL_TABLET | Freq: Every day | ORAL | 1 refills | Status: DC

## 2023-04-27 NOTE — Telephone Encounter (Signed)
Pt called stating she has been made aware her insurance will not cover farxiga. Requested Rx for medication to replace farxiga.

## 2023-04-27 NOTE — Telephone Encounter (Signed)
Pt made aware

## 2023-06-16 ENCOUNTER — Encounter: Payer: Self-pay | Admitting: Obstetrics and Gynecology

## 2023-06-26 ENCOUNTER — Telehealth: Payer: Self-pay | Admitting: "Endocrinology

## 2023-06-26 ENCOUNTER — Other Ambulatory Visit: Payer: Self-pay | Admitting: Obstetrics and Gynecology

## 2023-06-26 DIAGNOSIS — I10 Essential (primary) hypertension: Secondary | ICD-10-CM

## 2023-06-26 DIAGNOSIS — E1165 Type 2 diabetes mellitus with hyperglycemia: Secondary | ICD-10-CM

## 2023-06-26 DIAGNOSIS — Z1231 Encounter for screening mammogram for malignant neoplasm of breast: Secondary | ICD-10-CM

## 2023-06-26 NOTE — Telephone Encounter (Signed)
Order updated

## 2023-06-26 NOTE — Telephone Encounter (Signed)
Can someone update these labs?   

## 2023-06-28 ENCOUNTER — Ambulatory Visit: Payer: 59 | Admitting: "Endocrinology

## 2023-07-06 IMAGING — MG MM DIGITAL DIAGNOSTIC UNILAT*L* W/ TOMO W/ CAD
6 series · 6 of 18 positions shown · non-contrast
Comparison: Previous exam(s).

CLINICAL DATA: Patient was recalled from screening mammogram for a
possible asymmetry in the left breast.

EXAM:
DIGITAL DIAGNOSTIC UNILATERAL LEFT MAMMOGRAM WITH TOMOSYNTHESIS AND
CAD
TECHNIQUE: Left digital diagnostic mammography and breast tomosynthesis was
performed. The images were evaluated with computer-aided detection.

[L ML synth-2D]
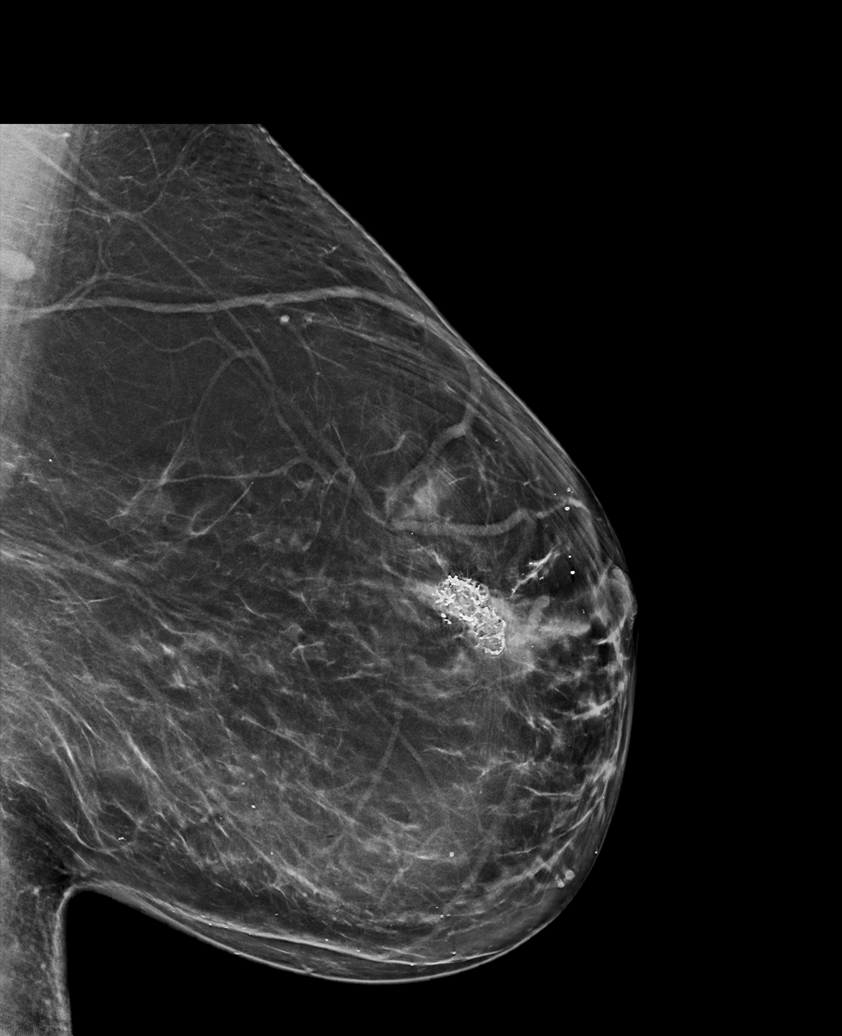

[L CC synth-2D (1 of 2)]
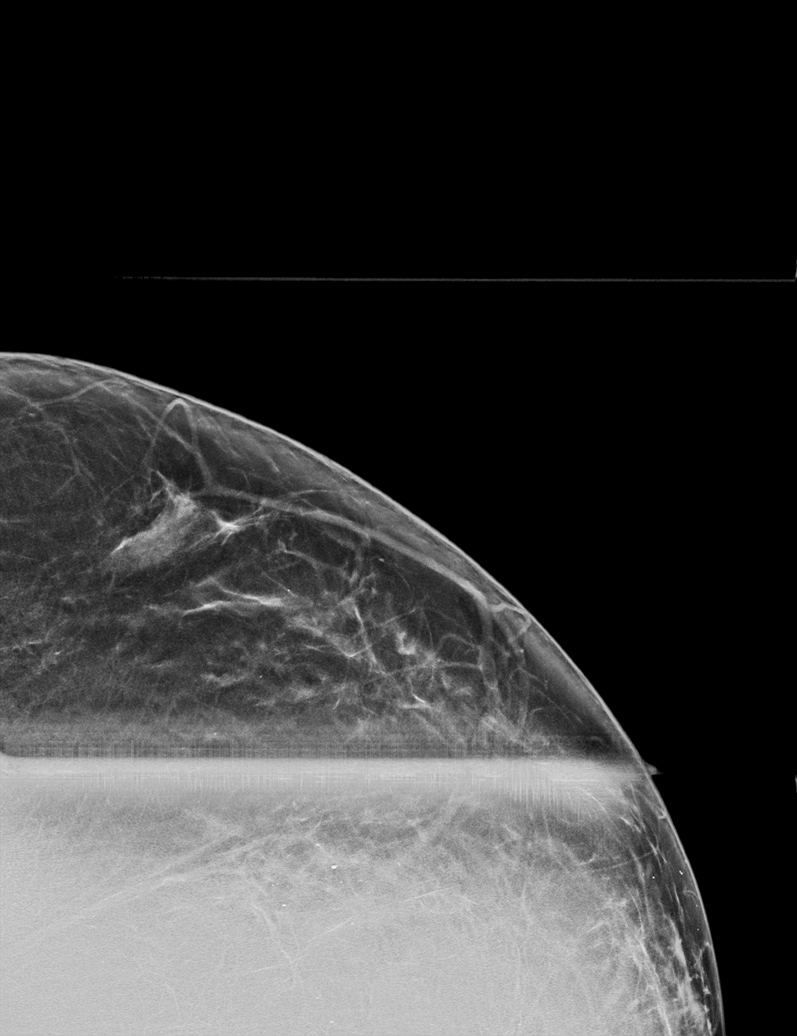

[L CC synth-2D (2 of 2)]
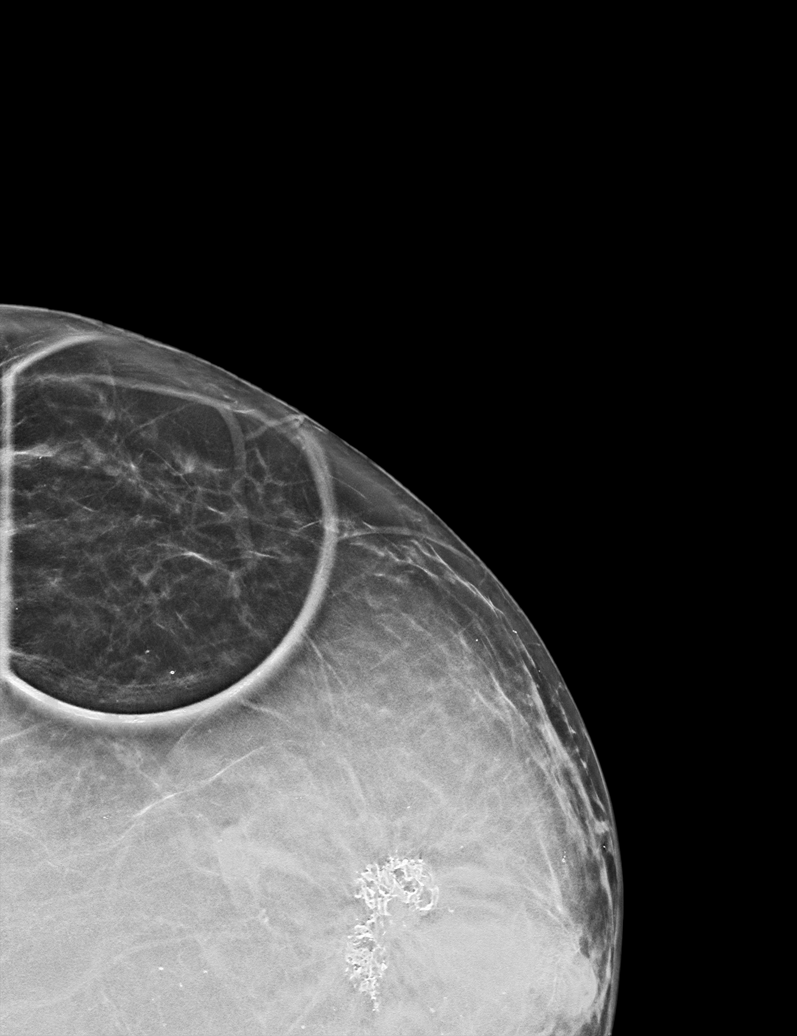

[L CC tomo (1 of 2) · tomo slice 27/54.0]
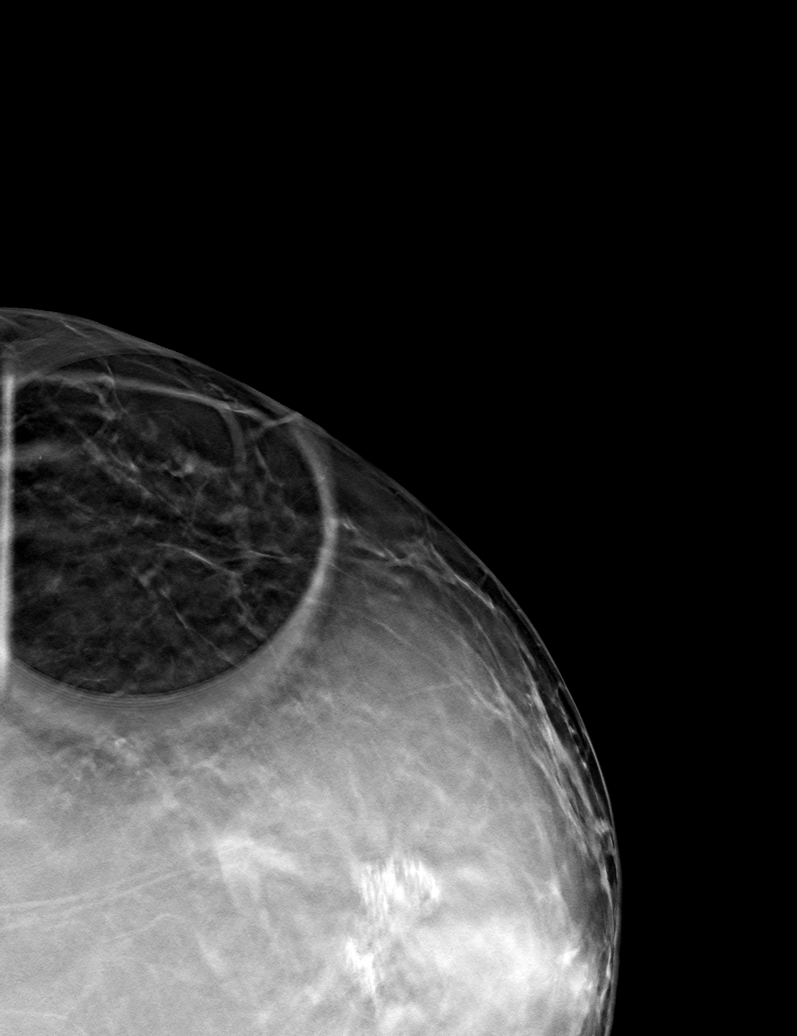

[L ML tomo · tomo slice 43/85.0]
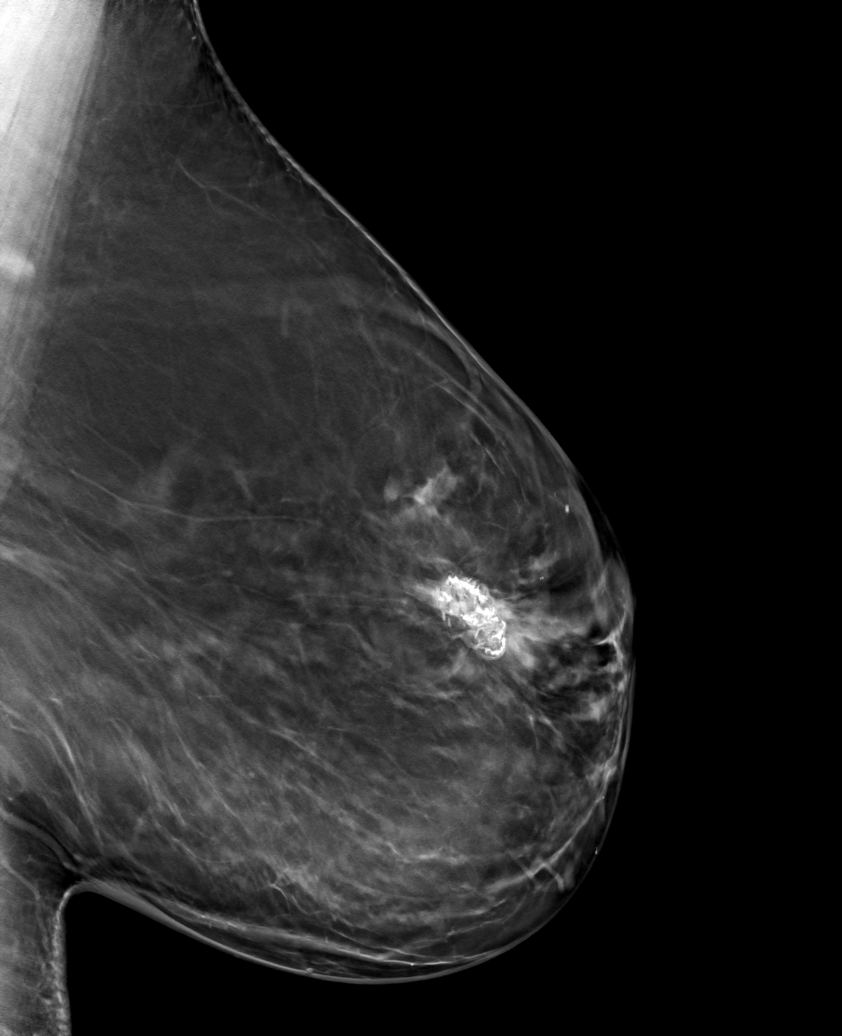

[L CC tomo (2 of 2) · tomo slice 28/55.0]
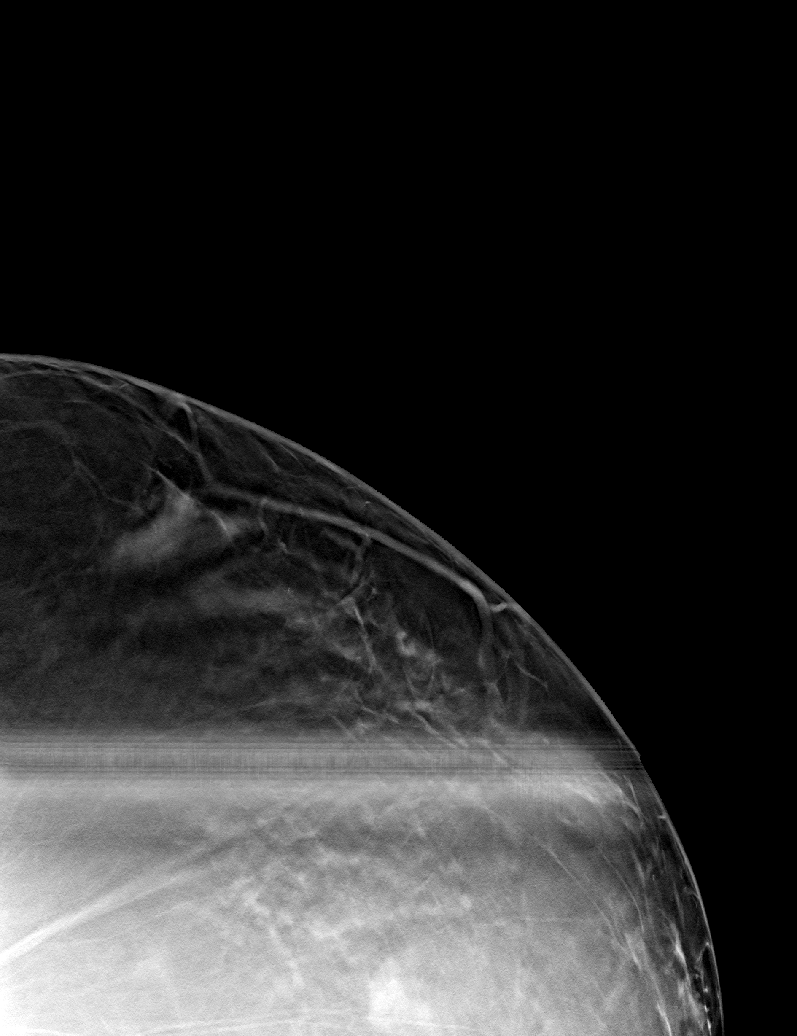

[6 of 18 positions shown; findings below may reference images not displayed]

ACR Breast Density Category b: There are scattered areas of
fibroglandular density.
FINDINGS: Additional imaging evaluation of the left breast was performed. No
persistent mass, distortion or malignant type microcalcifications
identified.
IMPRESSION: No evidence of malignancy in the left breast.

RECOMMENDATION:
Bilateral screening mammogram in 1 year is recommended.

I have discussed the findings and recommendations with the patient.
If applicable, a reminder letter will be sent to the patient
regarding the next appointment.

BI-RADS CATEGORY  1: Negative.

## 2023-08-01 ENCOUNTER — Ambulatory Visit
Admission: RE | Admit: 2023-08-01 | Discharge: 2023-08-01 | Disposition: A | Discharge status: Home / Self Care | Payer: 59 | Source: Ambulatory Visit | Attending: Obstetrics and Gynecology | Admitting: Obstetrics and Gynecology

## 2023-08-01 DIAGNOSIS — Z1231 Encounter for screening mammogram for malignant neoplasm of breast: Secondary | ICD-10-CM

## 2023-10-17 LAB — BASIC METABOLIC PANEL
BUN: 14 (ref 4–21)
Creatinine: 1.1 (ref 0.5–1.1)

## 2023-10-17 LAB — HEMOGLOBIN A1C: Hemoglobin A1C: 7.8

## 2023-10-23 ENCOUNTER — Ambulatory Visit: Payer: 59 | Admitting: "Endocrinology

## 2023-10-23 ENCOUNTER — Encounter: Payer: Self-pay | Admitting: "Endocrinology

## 2023-10-23 VITALS — BP 112/74 | HR 84 | Ht 60.0 in | Wt 200.8 lb

## 2023-10-23 DIAGNOSIS — E1165 Type 2 diabetes mellitus with hyperglycemia: Secondary | ICD-10-CM

## 2023-10-23 DIAGNOSIS — I10 Essential (primary) hypertension: Secondary | ICD-10-CM | POA: Diagnosis not present

## 2023-10-23 DIAGNOSIS — Z6839 Body mass index (BMI) 39.0-39.9, adult: Secondary | ICD-10-CM

## 2023-10-23 DIAGNOSIS — E66812 Obesity, class 2: Secondary | ICD-10-CM | POA: Diagnosis not present

## 2023-10-23 DIAGNOSIS — Z794 Long term (current) use of insulin: Secondary | ICD-10-CM | POA: Diagnosis not present

## 2023-10-23 MED ORDER — TRESIBA FLEXTOUCH 200 UNIT/ML ~~LOC~~ SOPN: 20.0000 [IU] | PEN_INJECTOR | Freq: Every day | SUBCUTANEOUS | 1 refills | Status: DC

## 2023-10-23 MED ORDER — GLIPIZIDE ER 5 MG PO TB24: 5.0000 mg | ORAL_TABLET | Freq: Every day | ORAL | 1 refills | Status: AC

## 2023-10-23 NOTE — Patient Instructions (Signed)

## 2023-10-23 NOTE — Progress Notes (Signed)
 10/23/2023, 11:25 AM  Endocrinology follow-up note   Subjective:    Patient ID: Julia Odom, female    DOB: May 22, 1976.  Julia Odom is being seen in consultation for management of currently uncontrolled symptomatic diabetes requested by  Valla Leaver, MD.   Past Medical History:  Diagnosis Date   Asthma    Diabetes mellitus, type II (HCC)    Frequent headaches    Hx of colonic polyps    Hypertension    Mood disorder (HCC)    Pregnancy induced hypertension     Past Surgical History:  Procedure Laterality Date   BREAST REDUCTION SURGERY     REDUCTION MAMMAPLASTY Bilateral 2008   WISDOM TOOTH EXTRACTION      Social History   Socioeconomic History   Marital status: Married    Spouse name: Not on file   Number of children: Not on file   Years of education: Not on file   Highest education level: Not on file  Occupational History   Not on file  Tobacco Use   Smoking status: Never   Smokeless tobacco: Not on file  Vaping Use   Vaping status: Never Used  Substance and Sexual Activity   Alcohol use: Yes    Comment: occasional but not while pregnant   Drug use: No   Sexual activity: Yes  Other Topics Concern   Not on file  Social History Narrative   Not on file   Social Drivers of Health   Financial Resource Strain: Not on file  Food Insecurity: Not on file  Transportation Needs: Not on file  Physical Activity: Not on file  Stress: Not on file  Social Connections: Unknown (03/10/2022)   Received from [facility], [facility]   Social Network    Social Network: Not on file    Family History  Problem Relation Age of Onset   Diabetes Mother    Cancer Mother    Diabetes Father    Cancer Father    Breast cancer Neg Hx     Outpatient Encounter Medications as of 10/23/2023  Medication Sig   acetaminophen (TYLENOL) 500 MG tablet Take 1,000 mg by  mouth every 6 (six) hours as needed for mild pain.   albuterol (PROAIR HFA) 108 (90 BASE) MCG/ACT inhaler Inhale 2 puffs into the lungs every 6 (six) hours as needed for wheezing or shortness of breath.   Blood Glucose Monitoring Suppl (ACCU-CHEK GUIDE ME) w/Device KIT 1 Piece by Does not apply route as directed.   Ciclesonide 37 MCG/ACT AERS Place into the nose.   fluticasone (FLONASE) 50 MCG/ACT nasal spray USE 1 TO 2 SPRAYS IN EACH NOSTRIL EVERY DAY   glipiZIDE (GLUCOTROL XL) 5 MG 24 hr tablet Take 1 tablet (5 mg total) by mouth daily with breakfast.   glucose blood (ACCU-CHEK GUIDE) test strip Use as instructed   Insulin Pen Needle 31G X 5 MM MISC Use to inject insulin   levocetirizine (XYZAL) 5 MG tablet Take 5 mg by mouth every evening.   losartan (COZAAR) 25 MG tablet Take 25 mg by mouth daily.   montelukast (  SINGULAIR) 10 MG tablet Take 10 mg by mouth at bedtime.   Multiple Vitamin (MULTIVITAMIN ADULT PO) Take 1 tablet by mouth daily in the afternoon.   TRELEGY ELLIPTA 200-62.5-25 MCG/ACT AEPB Inhale 1 puff into the lungs daily.   [DISCONTINUED] insulin degludec (TRESIBA FLEXTOUCH) 200 UNIT/ML FlexTouch Pen Inject 26 Units into the skin at bedtime.   Cholecalciferol (VITAMIN D3 PO) Take 1 tablet by mouth daily in the afternoon. (Patient not taking: Reported on 10/23/2023)   ELDERBERRY PO Take 1 tablet by mouth daily in the afternoon. (Patient not taking: Reported on 10/23/2023)   insulin degludec (TRESIBA FLEXTOUCH) 200 UNIT/ML FlexTouch Pen Inject 20 Units into the skin at bedtime.   [DISCONTINUED] empagliflozin (JARDIANCE) 10 MG TABS tablet Take 1 tablet (10 mg total) by mouth daily before breakfast. (Patient not taking: Reported on 10/23/2023)   [DISCONTINUED] glipiZIDE (GLUCOTROL XL) 5 MG 24 hr tablet Take 1 tablet (5 mg total) by mouth daily with breakfast. (Patient not taking: Reported on 10/23/2023)   No facility-administered encounter medications on file as of 10/23/2023.     ALLERGIES: Allergies  Allergen Reactions   Jardiance [Empagliflozin] Hives   Metformin And Related Nausea And Vomiting    VACCINATION STATUS: Immunization History  Administered Date(s) Administered   Tdap 07/02/2011    Diabetes She presents for her follow-up diabetic visit. She has type 2 diabetes mellitus. Onset time: She was diagnosed at approximate age of 43 years. Her disease course has been fluctuating. There are no hypoglycemic associated symptoms. Pertinent negatives for hypoglycemia include no confusion, headaches, pallor or seizures. Pertinent negatives for diabetes include no chest pain, no fatigue, no polydipsia, no polyphagia and no polyuria. There are no hypoglycemic complications. Symptoms are improving. There are no diabetic complications. Risk factors for coronary artery disease include obesity, sedentary lifestyle, diabetes mellitus, family history and hypertension. Current diabetic treatments: She did not tolerate Jardiance, metformin, Ozempic.  She was given a prescription for glipizide, however did not fill it. Her weight is fluctuating minimally. She is following a generally unhealthy diet. When asked about meal planning, she reported none. She has not had a previous visit with a dietitian. She never participates in exercise. Her home blood glucose trend is fluctuating minimally. Her breakfast blood glucose range is generally 140-180 mg/dl. Her bedtime blood glucose range is generally 180-200 mg/dl. Her overall blood glucose range is 180-200 mg/dl. (She presents with improved glycemic profile compared to her last presentation.  Her interval A1c was 7.8% improving from 10.7%.  She reports that she could not afford Jardiance or Farxiga, did not take her glipizide.  She is currently only on Tresiba 20 units nightly.  She denies hypoglycemia.  Her meter average is 182 mg per DL.     ) An ACE inhibitor/angiotensin II receptor blocker is being taken.  Hypertension This is a  chronic problem. The current episode started more than 1 year ago. The problem is controlled. Pertinent negatives include no chest pain, headaches, palpitations or shortness of breath. Risk factors for coronary artery disease include diabetes mellitus, family history, obesity and sedentary lifestyle. Past treatments include angiotensin blockers.     Review of Systems  Constitutional:  Negative for chills, fatigue, fever and unexpected weight change.  HENT:  Negative for trouble swallowing and voice change.   Eyes:  Negative for visual disturbance.  Respiratory:  Negative for cough, shortness of breath and wheezing.   Cardiovascular:  Negative for chest pain, palpitations and leg swelling.  Gastrointestinal:  Negative for  diarrhea, nausea and vomiting.  Endocrine: Negative for cold intolerance, heat intolerance, polydipsia, polyphagia and polyuria.  Musculoskeletal:  Negative for arthralgias and myalgias.  Skin:  Negative for color change, pallor, rash and wound.  Neurological:  Negative for seizures and headaches.  Psychiatric/Behavioral:  Negative for confusion and suicidal ideas.     Objective:       10/23/2023   10:42 AM 04/26/2023    1:57 PM 03/21/2023   11:35 AM  Vitals with BMI  Height 5\' 0"  5' 0.5" 5' 0.5"  Weight 200 lbs 13 oz 203 lbs 13 oz 201 lbs 3 oz  BMI 39.22 39.13 38.63  Systolic 112 120 161  Diastolic 74 78 72  Pulse 84 64 64    BP 112/74 (BP Location: Left Arm, Patient Position: Sitting, Cuff Size: Large)   Pulse 84   Ht 5' (1.524 m)   Wt 200 lb 12.8 oz (91.1 kg)   BMI 39.22 kg/m   Wt Readings from Last 3 Encounters:  10/23/23 200 lb 12.8 oz (91.1 kg)  04/26/23 203 lb 12.8 oz (92.4 kg)  03/21/23 201 lb 3.2 oz (91.3 kg)       Recent Results (from the past 2160 hours)  Basic metabolic panel     Status: None   Collection Time: 10/17/23 12:00 AM  Result Value Ref Range   BUN 14 4 - 21   Creatinine 1.1 0.5 - 1.1  Hemoglobin A1c     Status: None    Collection Time: 10/17/23 12:00 AM  Result Value Ref Range   Hemoglobin A1C 7.8     Lipid Panel     Component Value Date/Time   CHOL 135 06/04/2021 0000   TRIG 68 06/04/2021 0000   HDL 55 06/04/2021 0000   LDLCALC 67 06/04/2021 0000    Assessment & Plan:   1. Type 2 diabetes mellitus with hyperglycemia, without long-term current use of insulin (HCC)   - Julia Odom has currently uncontrolled symptomatic type 2 DM since  48 years of age.  She presents with improved glycemic profile compared to her last presentation.  Her interval A1c was 7.8% improving from 10.7%.  She reports that she could not afford Jardiance or Farxiga, did not take her glipizide.  She is currently only on Tresiba 20 units nightly.  She denies hypoglycemia.   - Recent labs reviewed. - I had a long discussion with her about the progressive nature of diabetes and the pathology behind its complications. -her diabetes is complicated by obesity/sedentary life, hypertension, and she remains at a high risk for more acute and chronic complications which include CAD, CVA, CKD, retinopathy, and neuropathy. These are all discussed in detail with her.  - I discussed all available options of managing her diabetes including de-escalation of medications. I have counseled her on diet  and weight management  by adopting a Whole Food , Plant Predominant  ( WFPP) nutrition as recommended by [employer] of Lifestyle Medicine. Patient is encouraged to switch to  unprocessed or minimally processed  complex starch, adequate protein intake (mainly plant source), minimal liquid fat ( mainly vegetable oils), plenty of fruits, and vegetables. -  she is advised to stick to a routine mealtimes to eat 3 complete meals a day and snack only when necessary ( to snack only to correct hypoglycemia BG <70 day time or <100 at night).   -This patient is responding to lifestyle medicine.  She was not given any medications for diabetes during  her last visit.  - she acknowledges that there is a room for improvement in her food and drink choices. - Suggestion is made for her to avoid simple carbohydrates  from her diet including Cakes, Sweet Desserts, Ice Cream, Soda (diet and regular), Sweet Tea, Candies, Chips, Cookies, Store Bought Juices, Alcohol , Artificial Sweeteners,  Coffee Creamer, and "Sugar-free" Products, Lemonade. This will help patient to have more stable blood glucose profile and potentially avoid unintended weight gain.  The following Lifestyle Medicine recommendations according to American College of Lifestyle Medicine  [facility]) were discussed and and offered to patient and she  agrees to start the journey:  A. Whole Foods, Plant-Based Nutrition comprising of fruits and vegetables, plant-based proteins, whole-grain carbohydrates was discussed in detail with the patient.   A list for source of those nutrients were also provided to the patient.  Patient will use only water or unsweetened tea for hydration. B.  The need to stay away from risky substances including alcohol, smoking; obtaining 7 to 9 hours of restorative sleep, at least 150 minutes of moderate intensity exercise weekly, the importance of healthy social connections,  and stress management techniques were discussed. C.  A full color page of  Calorie density of various food groups per pound showing examples of each food groups was provided to the patient.  - she will be scheduled with Norm Salt, RDN, CDE for individualized diabetes education.  - I have approached her with the following plan to manage  her diabetes and patient agrees:   -In light of her presentation with improving glycemic profile, she will not need prandial insulin for now.  She is advised to continue Tresiba 20 units nightly.  Since she is not affording the co-pays of the SGLT2 inhibitors, she is advised to resume glipizide 5 mg XL p.o. daily at breakfast.    -She is advised to continue with  monitoring of blood glucose 2 times a day-before breakfast and at bedtime.      She is encouraged to call clinic for blood glucose levels less than 70 or above 200 mg per DL at fasting.     She does not tolerate metformin.   -She does have concerns for cost of medications, will be considered for GLP-1 receptor agonist as appropriate on subsequent visits.  - Specific targets for  A1c;  LDL, HDL,  and Triglycerides were discussed with the patient.  2) Blood Pressure /Hypertension:  Her blood pressure is controlled to target.   She is advised to continue losartan 25 mg p.o. daily, however advised to discontinue hydrochlorothiazide.   3) Lipids:   Review of her recent lipid panel showed  controlled  LDL at 67 .  she is not on statins.  The above detailed WF PB diet will help with managing lipids as well as hypertension.  She will have fasting lipid panel before next visit.  4)  Weight/Diet:  Body mass index is 39.22 kg/m.  -   clearly complicating her diabetes care.   she is  a candidate for weight loss. I discussed with her the fact that loss of 5 - 10% of her  current body weight will have the most impact on her diabetes management.  The above detailed  ACLM recommendations for nutrition, exercise, sleep, social life, avoidance of risky substances, the need for restorative sleep   information will also detailed on discharge instructions.  5) Chronic Care/Health Maintenance:  -she  is on ARB  medications and  is encouraged  to initiate and continue to follow up with Ophthalmology, Dentist,  Podiatrist at least yearly or according to recommendations, and advised to   stay away from smoking. I have recommended yearly flu vaccine and pneumonia vaccine at least every 5 years; moderate intensity exercise for up to 150 minutes weekly; and  sleep for 7- 9 hours a day.  -Her diabetes foot exam is normal today.  - she is  advised to maintain close follow up with Valla Leaver, MD for primary  care needs, as well as her other providers for optimal and coordinated care.   I spent  26  minutes in the care of the patient today including review of labs from CMP, Lipids, Thyroid Function, Hematology (current and previous including abstractions from other facilities); face-to-face time discussing  her blood glucose readings/logs, discussing hypoglycemia and hyperglycemia episodes and symptoms, medications doses, her options of short and long term treatment based on the latest standards of care / guidelines;  discussion about incorporating lifestyle medicine;  and documenting the encounter. Risk reduction counseling performed per USPSTF guidelines to reduce  obesity and cardiovascular risk factors.     Please refer to Patient Instructions for Blood Glucose Monitoring and Insulin/Medications Dosing Guide"  in media tab for additional information. Please  also refer to " Patient Self Inventory" in the Media  tab for reviewed elements of pertinent patient history.  Darrelyn Hillock participated in the discussions, expressed understanding, and voiced agreement with the above plans.  All questions were answered to her satisfaction. she is encouraged to contact clinic should she have any questions or concerns prior to her return visit.    Follow up plan: - Return in about 4 months (around 02/22/2024) for Bring Meter/CGM Device/Logs- A1c in Office.  Marquis Lunch, MD [facility] [address] [place], [place] [address] Phone: [phone]  Fax: [phone]    10/23/2023, 11:25 AM  This note was partially dictated with voice recognition software. Similar sounding words can be transcribed inadequately or may not  be corrected upon review.

## 2023-10-28 ENCOUNTER — Other Ambulatory Visit: Payer: Self-pay | Admitting: "Endocrinology

## 2023-11-03 ENCOUNTER — Other Ambulatory Visit: Payer: Self-pay | Admitting: "Endocrinology

## 2024-01-18 ENCOUNTER — Other Ambulatory Visit: Payer: Self-pay | Admitting: "Endocrinology

## 2024-01-18 ENCOUNTER — Telehealth: Payer: Self-pay

## 2024-01-18 MED ORDER — INSULIN DEGLUDEC FLEXTOUCH 200 UNIT/ML ~~LOC~~ SOPN: 25.0000 [IU] | PEN_INJECTOR | Freq: Every day | SUBCUTANEOUS | 0 refills | Status: DC

## 2024-01-18 NOTE — Telephone Encounter (Signed)
 Pt called stating her PCP increased her Tresiba  25 units at bedtime at her last visit. Requested Rx refill for Tresiba  25 units at bedtime.

## 2024-01-19 NOTE — Telephone Encounter (Signed)
 Noted

## 2024-02-26 ENCOUNTER — Encounter: Payer: Self-pay | Admitting: "Endocrinology

## 2024-02-26 ENCOUNTER — Ambulatory Visit: Admitting: "Endocrinology

## 2024-02-26 VITALS — BP 116/64 | HR 76 | Ht 60.0 in | Wt 204.8 lb

## 2024-02-26 DIAGNOSIS — Z794 Long term (current) use of insulin: Secondary | ICD-10-CM | POA: Diagnosis not present

## 2024-02-26 DIAGNOSIS — I10 Essential (primary) hypertension: Secondary | ICD-10-CM

## 2024-02-26 DIAGNOSIS — E1165 Type 2 diabetes mellitus with hyperglycemia: Secondary | ICD-10-CM

## 2024-02-26 MED ORDER — INSULIN DEGLUDEC FLEXTOUCH 200 UNIT/ML ~~LOC~~ SOPN: 30.0000 [IU] | PEN_INJECTOR | Freq: Every day | SUBCUTANEOUS | 1 refills | Status: DC

## 2024-02-26 NOTE — Progress Notes (Signed)
 02/26/2024, 12:19 PM  Endocrinology follow-up note   Subjective:    Patient ID: Julia Odom, female    DOB: 1975/11/17.  Julia Odom is being seen in consultation for management of currently uncontrolled symptomatic diabetes requested by  Marian Pat, MD.   Past Medical History:  Diagnosis Date   Asthma    Diabetes mellitus, type II (HCC)    Frequent headaches    Hx of colonic polyps    Hypertension    Mood disorder (HCC)    Pregnancy induced hypertension     Past Surgical History:  Procedure Laterality Date   BREAST REDUCTION SURGERY     REDUCTION MAMMAPLASTY Bilateral 2008   WISDOM TOOTH EXTRACTION      Social History   Socioeconomic History   Marital status: Married    Spouse name: Not on file   Number of children: Not on file   Years of education: Not on file   Highest education level: Not on file  Occupational History   Not on file  Tobacco Use   Smoking status: Never   Smokeless tobacco: Not on file  Vaping Use   Vaping status: Never Used  Substance and Sexual Activity   Alcohol use: Yes    Comment: occasional but not while pregnant   Drug use: No   Sexual activity: Yes  Other Topics Concern   Not on file  Social History Narrative   Not on file   Social Drivers of Health   Financial Resource Strain: Not on file  Food Insecurity: Not on file  Transportation Needs: Not on file  Physical Activity: Not on file  Stress: Not on file  Social Connections: Unknown (03/10/2022)   Received from [facility]   Social Network    Social Network: Not on file    Family History  Problem Relation Age of Onset   Diabetes Mother    Cancer Mother    Diabetes Father    Cancer Father    Breast cancer Neg Hx     Outpatient Encounter Medications as of 02/26/2024  Medication Sig   [DISCONTINUED] Insulin  Degludec FlexTouch 200 UNIT/ML SOPN Inject 25  Units into the skin at bedtime. (Patient taking differently: Inject 26 Units into the skin at bedtime.)   acetaminophen  (TYLENOL ) 500 MG tablet Take 1,000 mg by mouth every 6 (six) hours as needed for mild pain.   albuterol (PROAIR HFA) 108 (90 BASE) MCG/ACT inhaler Inhale 2 puffs into the lungs every 6 (six) hours as needed for wheezing or shortness of breath.   Blood Glucose Monitoring Suppl (ACCU-CHEK GUIDE ME) w/Device KIT 1 Piece by Does not apply route as directed.   Cholecalciferol (VITAMIN D3 PO) Take 1 tablet by mouth daily in the afternoon. (Patient not taking: Reported on 10/23/2023)   Ciclesonide 37 MCG/ACT AERS Place into the nose.   ELDERBERRY PO Take 1 tablet by mouth daily in the afternoon. (Patient not taking: Reported on 10/23/2023)   fluticasone (FLONASE) 50 MCG/ACT nasal spray USE 1 TO 2 SPRAYS IN EACH NOSTRIL EVERY DAY   glipiZIDE  (GLUCOTROL  XL) 5 MG 24 hr tablet  Take 1 tablet (5 mg total) by mouth daily with breakfast.   glucose blood (ACCU-CHEK GUIDE) test strip Use as instructed   Insulin  Degludec FlexTouch 200 UNIT/ML SOPN Inject 30 Units into the skin at bedtime.   Insulin  Pen Needle 31G X 5 MM MISC Use to inject insulin    levocetirizine (XYZAL) 5 MG tablet Take 5 mg by mouth every evening.   losartan (COZAAR) 25 MG tablet Take 25 mg by mouth daily.   montelukast (SINGULAIR) 10 MG tablet Take 10 mg by mouth at bedtime.   Multiple Vitamin (MULTIVITAMIN ADULT PO) Take 1 tablet by mouth daily in the afternoon.   TRELEGY ELLIPTA 200-62.5-25 MCG/ACT AEPB Inhale 1 puff into the lungs daily.   No facility-administered encounter medications on file as of 02/26/2024.    ALLERGIES: Allergies  Allergen Reactions   Jardiance  [Empagliflozin ] Hives   Metformin And Related Nausea And Vomiting    VACCINATION STATUS: Immunization History  Administered Date(s) Administered   Tdap 07/02/2011    Diabetes She presents for her follow-up diabetic visit. She has type 2 diabetes  mellitus. Onset time: She was diagnosed at approximate age of 43 years. Her disease course has been worsening. There are no hypoglycemic associated symptoms. Pertinent negatives for hypoglycemia include no confusion, headaches, pallor or seizures. Pertinent negatives for diabetes include no chest pain, no fatigue, no polydipsia, no polyphagia and no polyuria. There are no hypoglycemic complications. Symptoms are worsening. There are no diabetic complications. Risk factors for coronary artery disease include obesity, sedentary lifestyle, diabetes mellitus, family history and hypertension. Her weight is fluctuating minimally. She is following a generally unhealthy diet. When asked about meal planning, she reported none. She has not had a previous visit with a dietitian. She never participates in exercise. Her home blood glucose trend is increasing steadily. Her breakfast blood glucose range is generally 140-180 mg/dl. Her bedtime blood glucose range is generally 180-200 mg/dl. Her overall blood glucose range is 180-200 mg/dl. (She presents with worsening glycemic profile and previsit A1c of 8.3% increasing from 7.8%, admittedly has not been taking her glipizide  consistently.  She does not tolerate SGLT2 inhibitors and does not have adequate coverage for GLP-1 receptor agonists.        ) An ACE inhibitor/angiotensin II receptor blocker is being taken.  Hypertension This is a chronic problem. The current episode started more than 1 year ago. The problem is controlled. Pertinent negatives include no chest pain, headaches, palpitations or shortness of breath. Risk factors for coronary artery disease include diabetes mellitus, family history, obesity and sedentary lifestyle. Past treatments include angiotensin blockers.       Objective:       02/26/2024   11:21 AM 10/23/2023   10:42 AM 04/26/2023    1:57 PM  Vitals with BMI  Height 5' 0 5' 0 5' 0.5  Weight 204 lbs 13 oz 200 lbs 13 oz 203 lbs 13 oz  BMI  40 39.22 39.13  Systolic 116 112 879  Diastolic 64 74 78  Pulse 76 84 64    BP 116/64   Pulse 76   Ht 5' (1.524 m)   Wt 204 lb 12.8 oz (92.9 kg)   BMI 40.00 kg/m   Wt Readings from Last 3 Encounters:  02/26/24 204 lb 12.8 oz (92.9 kg)  10/23/23 200 lb 12.8 oz (91.1 kg)  04/26/23 203 lb 12.8 oz (92.4 kg)       Lipid Panel     Component Value Date/Time   CHOL  135 06/04/2021 0000   TRIG 68 06/04/2021 0000   HDL 55 06/04/2021 0000   LDLCALC 67 06/04/2021 0000    Assessment & Plan:   1. Type 2 diabetes mellitus with hyperglycemia, without long-term current use of insulin  (HCC)   - Julia Odom has currently uncontrolled symptomatic type 2 DM since  48 years of age.  She presents with worsening glycemic profile and previsit A1c of 8.3% increasing from 7.8%, admittedly has not been taking her glipizide  consistently.  She does not tolerate SGLT2 inhibitors and does not have adequate coverage for GLP-1 receptor agonists.   - Recent labs reviewed. - I had a long discussion with her about the progressive nature of diabetes and the pathology behind its complications. -her diabetes is complicated by obesity/sedentary life, hypertension, and she remains at a high risk for more acute and chronic complications which include CAD, CVA, CKD, retinopathy, and neuropathy. These are all discussed in detail with her.  - I discussed all available options of managing her diabetes including de-escalation of medications. I have counseled her on diet  and weight management  by adopting a Whole Food , Plant Predominant  ( WFPP) nutrition as recommended by [employer] of Lifestyle Medicine. Patient is encouraged to switch to  unprocessed or minimally processed  complex starch, adequate protein intake (mainly plant source), minimal liquid fat ( mainly vegetable oils), plenty of fruits, and vegetables. -  she is advised to stick to a routine mealtimes to eat 3 complete meals a day and snack only  when necessary ( to snack only to correct hypoglycemia BG <70 day time or <100 at night).   -This patient is responding to lifestyle medicine.  She was not given any medications for diabetes during her last visit.  - she acknowledges that there is a room for improvement in her food and drink choices. - Suggestion is made for her to avoid simple carbohydrates  from her diet including Cakes, Sweet Desserts, Ice Cream, Soda (diet and regular), Sweet Tea, Candies, Chips, Cookies, Store Bought Juices, Alcohol , Artificial Sweeteners,  Coffee Creamer, and Sugar-free Products, Lemonade. This will help patient to have more stable blood glucose profile and potentially avoid unintended weight gain.  The following Lifestyle Medicine recommendations according to American College of Lifestyle Medicine  [facility]) were discussed and and offered to patient and she  agrees to start the journey:  A. Whole Foods, Plant-Based Nutrition comprising of fruits and vegetables, plant-based proteins, whole-grain carbohydrates was discussed in detail with the patient.   A list for source of those nutrients were also provided to the patient.  Patient will use only water or unsweetened tea for hydration. B.  The need to stay away from risky substances including alcohol, smoking; obtaining 7 to 9 hours of restorative sleep, at least 150 minutes of moderate intensity exercise weekly, the importance of healthy social connections,  and stress management techniques were discussed. C.  A full color page of  Calorie density of various food groups per pound showing examples of each food groups was provided to the patient.   - she will be scheduled with Penny Crumpton, RDN, CDE for individualized diabetes education.  - I have approached her with the following plan to manage  her diabetes and patient agrees:   -In light of her presentation with significantly above target glycemic profile, I discussed and increase her Tresiba  to 30 units  nightly.    Since she is not affording the co-pays of the  SGLT2 inhibitors, she is advised to resume glipizide  5 mg XL p.o. daily at breakfast.    -She is advised to continue with monitoring of blood glucose 2 times a day-before breakfast and at bedtime.      She is encouraged to call clinic for blood glucose levels less than 70 or above 200 mg per DL at fasting.     She does not tolerate metformin.   -She does have concerns for cost of medications, will be considered for GLP-1 receptor agonist as appropriate on subsequent visits.  - Specific targets for  A1c;  LDL, HDL,  and Triglycerides were discussed with the patient.  2) Blood Pressure /Hypertension:  Her blood pressure is controlled to target.   She is advised to continue losartan 25 mg p.o. daily, however advised to discontinue hydrochlorothiazide.   3) Lipids:   Review of her recent lipid panel showed  controlled  LDL at 67 .  she is not on statins.  The above detailed WF PB diet will help with managing lipids as well as hypertension.  She will have fasting lipid panel before next visit.  4)  Weight/Diet:  Body mass index is 40 kg/m.  -   clearly complicating her diabetes care.   she is  a candidate for weight loss. I discussed with her the fact that loss of 5 - 10% of her  current body weight will have the most impact on her diabetes management.  The above detailed  ACLM recommendations for nutrition, exercise, sleep, social life, avoidance of risky substances, the need for restorative sleep   information will also detailed on discharge instructions.  5) Chronic Care/Health Maintenance:  -she  is on ARB  medications and  is encouraged to initiate and continue to follow up with Ophthalmology, Dentist,  Podiatrist at least yearly or according to recommendations, and advised to   stay away from smoking. I have recommended yearly flu vaccine and pneumonia vaccine at least every 5 years; moderate intensity exercise for up to 150 minutes  weekly; and  sleep for 7- 9 hours a day.  -Her diabetes foot exam is normal on October 23, 2023 .  - she is  advised to maintain close follow up with Marian Pat, MD for primary care needs, as well as her other providers for optimal and coordinated care.   I spent  26  minutes in the care of the patient today including review of labs from CMP, Lipids, Thyroid Function, Hematology (current and previous including abstractions from other facilities); face-to-face time discussing  her blood glucose readings/logs, discussing hypoglycemia and hyperglycemia episodes and symptoms, medications doses, her options of short and long term treatment based on the latest standards of care / guidelines;  discussion about incorporating lifestyle medicine;  and documenting the encounter. Risk reduction counseling performed per USPSTF guidelines to reduce  obesity and cardiovascular risk factors.     Please refer to Patient Instructions for Blood Glucose Monitoring and Insulin /Medications Dosing Guide  in media tab for additional information. Please  also refer to  Patient Self Inventory in the Media  tab for reviewed elements of pertinent patient history.  Julia Odom participated in the discussions, expressed understanding, and voiced agreement with the above plans.  All questions were answered to her satisfaction. she is encouraged to contact clinic should she have any questions or concerns prior to her return visit.   Follow up plan: - Return in about 4 months (around 06/28/2024) for F/U with Pre-visit Labs,  Meter/CGM/Logs, A1c here.  Ranny Earl, MD [facility] [address] [place], [place] [address] Phone: [phone]  Fax: [phone]    02/26/2024, 12:19 PM  This note was partially dictated with voice recognition software. Similar sounding words can be transcribed inadequately or may not  be corrected upon review.

## 2024-02-26 NOTE — Patient Instructions (Signed)

## 2024-04-07 ENCOUNTER — Other Ambulatory Visit: Payer: Self-pay | Admitting: "Endocrinology

## 2024-05-09 ENCOUNTER — Other Ambulatory Visit: Payer: Self-pay | Admitting: "Endocrinology

## 2024-06-26 ENCOUNTER — Other Ambulatory Visit: Payer: Self-pay | Admitting: Obstetrics and Gynecology

## 2024-06-26 ENCOUNTER — Other Ambulatory Visit (HOSPITAL_COMMUNITY)
Admission: RE | Admit: 2024-06-26 | Discharge: 2024-06-26 | Disposition: A | Discharge status: Home / Self Care | Source: Ambulatory Visit | Attending: Obstetrics and Gynecology | Admitting: Obstetrics and Gynecology

## 2024-06-26 DIAGNOSIS — Z01419 Encounter for gynecological examination (general) (routine) without abnormal findings: Secondary | ICD-10-CM | POA: Insufficient documentation

## 2024-07-03 ENCOUNTER — Ambulatory Visit: Admitting: "Endocrinology

## 2024-07-03 LAB — CYTOLOGY - PAP
Comment: NEGATIVE
Diagnosis: NEGATIVE
Diagnosis: REACTIVE
High risk HPV: NEGATIVE

## 2024-08-28 ENCOUNTER — Telehealth: Payer: Self-pay | Admitting: "Endocrinology

## 2024-08-28 NOTE — Telephone Encounter (Signed)
 Pt has an appt tomorrow and called and stated she had labs done in Nov, I do not see them.  She is stating she can not do more labs and her doctor will not draw them so she wants to be seen with what was done.  I only see a PAP from Nov.  Pt will need a call back on why these labs are different and what's needed.

## 2024-08-29 ENCOUNTER — Ambulatory Visit: Admitting: "Endocrinology

## 2024-08-29 NOTE — Telephone Encounter (Signed)
 Spoke to pt she knows she must do thyroid labs

## 2024-10-09 ENCOUNTER — Ambulatory Visit: Admitting: "Endocrinology
